# Patient Record
Sex: Female | Born: 1960 | Race: Black or African American | Hispanic: No | Marital: Married | Smoking: Never smoker
Health system: Southern US, Community
[De-identification: ages and names within clinical notes are randomized; demographics above are authoritative.]

## PROBLEM LIST (undated history)

## (undated) HISTORY — PX: HERNIA REPAIR: SHX51

## (undated) HISTORY — PX: OTHER SURGICAL HISTORY: SHX169

## (undated) HISTORY — PX: ABDOMINAL HYSTERECTOMY: SHX81

---

## 2004-04-01 DIAGNOSIS — K2900 Acute gastritis without bleeding: Secondary | ICD-10-CM | POA: Insufficient documentation

## 2005-05-04 DIAGNOSIS — H4010X0 Unspecified open-angle glaucoma, stage unspecified: Secondary | ICD-10-CM | POA: Insufficient documentation

## 2011-04-10 DIAGNOSIS — H35419 Lattice degeneration of retina, unspecified eye: Secondary | ICD-10-CM | POA: Insufficient documentation

## 2011-08-17 DIAGNOSIS — N750 Cyst of Bartholin's gland: Secondary | ICD-10-CM | POA: Insufficient documentation

## 2012-08-17 DIAGNOSIS — M76899 Other specified enthesopathies of unspecified lower limb, excluding foot: Secondary | ICD-10-CM | POA: Insufficient documentation

## 2014-01-15 DIAGNOSIS — Z800 Family history of malignant neoplasm of digestive organs: Secondary | ICD-10-CM | POA: Insufficient documentation

## 2014-09-10 DIAGNOSIS — M545 Low back pain, unspecified: Secondary | ICD-10-CM | POA: Insufficient documentation

## 2014-09-10 DIAGNOSIS — M47816 Spondylosis without myelopathy or radiculopathy, lumbar region: Secondary | ICD-10-CM | POA: Insufficient documentation

## 2015-07-01 DIAGNOSIS — Z833 Family history of diabetes mellitus: Secondary | ICD-10-CM | POA: Insufficient documentation

## 2017-05-30 ENCOUNTER — Encounter: Payer: Self-pay | Admitting: Emergency Medicine

## 2017-05-30 ENCOUNTER — Observation Stay
Admission: EM | Admit: 2017-05-30 | Discharge: 2017-06-02 | Disposition: A | Discharge status: Home / Self Care | Payer: BC Managed Care – PPO | Attending: Internal Medicine | Admitting: Internal Medicine

## 2017-05-30 ENCOUNTER — Emergency Department: Payer: BC Managed Care – PPO

## 2017-05-30 DIAGNOSIS — K2950 Unspecified chronic gastritis without bleeding: Secondary | ICD-10-CM | POA: Insufficient documentation

## 2017-05-30 DIAGNOSIS — Z888 Allergy status to other drugs, medicaments and biological substances status: Secondary | ICD-10-CM | POA: Diagnosis not present

## 2017-05-30 DIAGNOSIS — R112 Nausea with vomiting, unspecified: Secondary | ICD-10-CM

## 2017-05-30 DIAGNOSIS — K5900 Constipation, unspecified: Secondary | ICD-10-CM | POA: Insufficient documentation

## 2017-05-30 DIAGNOSIS — E876 Hypokalemia: Secondary | ICD-10-CM | POA: Diagnosis not present

## 2017-05-30 DIAGNOSIS — K219 Gastro-esophageal reflux disease without esophagitis: Secondary | ICD-10-CM | POA: Diagnosis not present

## 2017-05-30 DIAGNOSIS — R109 Unspecified abdominal pain: Secondary | ICD-10-CM

## 2017-05-30 DIAGNOSIS — K257 Chronic gastric ulcer without hemorrhage or perforation: Principal | ICD-10-CM | POA: Insufficient documentation

## 2017-05-30 DIAGNOSIS — Z79899 Other long term (current) drug therapy: Secondary | ICD-10-CM | POA: Insufficient documentation

## 2017-05-30 DIAGNOSIS — R1010 Upper abdominal pain, unspecified: Secondary | ICD-10-CM

## 2017-05-30 LAB — CBC
HCT: 46.5 % (ref 35.0–47.0)
HEMOGLOBIN: 15.2 g/dL (ref 12.0–16.0)
MCH: 27.5 pg (ref 26.0–34.0)
MCHC: 32.7 g/dL (ref 32.0–36.0)
MCV: 84 fL (ref 80.0–100.0)
PLATELETS: 372 10*3/uL (ref 150–440)
RBC: 5.53 MIL/uL — AB (ref 3.80–5.20)
RDW: 14 % (ref 11.5–14.5)
WBC: 12.9 10*3/uL — AB (ref 3.6–11.0)

## 2017-05-30 LAB — TROPONIN I

## 2017-05-30 LAB — COMPREHENSIVE METABOLIC PANEL
ALK PHOS: 94 U/L (ref 38–126)
ALT: 14 U/L (ref 14–54)
ANION GAP: 12 (ref 5–15)
AST: 18 U/L (ref 15–41)
Albumin: 4 g/dL (ref 3.5–5.0)
BUN: 19 mg/dL (ref 6–20)
CALCIUM: 9.7 mg/dL (ref 8.9–10.3)
CO2: 23 mmol/L (ref 22–32)
CREATININE: 0.94 mg/dL (ref 0.44–1.00)
Chloride: 104 mmol/L (ref 101–111)
Glucose, Bld: 122 mg/dL — ABNORMAL HIGH (ref 65–99)
Potassium: 3.4 mmol/L — ABNORMAL LOW (ref 3.5–5.1)
SODIUM: 139 mmol/L (ref 135–145)
TOTAL PROTEIN: 8.1 g/dL (ref 6.5–8.1)
Total Bilirubin: 0.7 mg/dL (ref 0.3–1.2)

## 2017-05-30 LAB — LIPASE, BLOOD: Lipase: 26 U/L (ref 11–51)

## 2017-05-30 MED ORDER — FENTANYL CITRATE (PF) 100 MCG/2ML IJ SOLN
50.0000 ug | Freq: Once | INTRAMUSCULAR | Status: AC
  Administered 2017-05-30: 50 ug via INTRAVENOUS
  Filled 2017-05-30: qty 2

## 2017-05-30 MED ORDER — SODIUM CHLORIDE 0.9 % IV BOLUS (SEPSIS)
1000.0000 mL | Freq: Once | INTRAVENOUS | Status: AC
  Administered 2017-05-30: 1000 mL via INTRAVENOUS

## 2017-05-30 MED ORDER — ONDANSETRON HCL 4 MG/2ML IJ SOLN
4.0000 mg | Freq: Once | INTRAMUSCULAR | Status: AC
  Administered 2017-05-30: 4 mg via INTRAVENOUS
  Filled 2017-05-30: qty 2

## 2017-05-30 MED ORDER — IOPAMIDOL (ISOVUE-300) INJECTION 61%
30.0000 mL | Freq: Once | INTRAVENOUS | Status: AC | PRN
  Administered 2017-05-30: 30 mL via ORAL

## 2017-05-30 MED ORDER — IOPAMIDOL (ISOVUE-300) INJECTION 61%
100.0000 mL | Freq: Once | INTRAVENOUS | Status: AC | PRN
  Administered 2017-05-30: 100 mL via INTRAVENOUS

## 2017-05-30 NOTE — ED Triage Notes (Signed)
Pt states bilateral upper quadrant pain with nausea and vomiting since Friday. Pt appears in distress. Pt denies known fever. Denies diarrhea or shob. Last bowel movement on Wednesday.

## 2017-05-30 NOTE — ED Provider Notes (Signed)
[facility] Emergency Department Provider Note   ____________________________________________   First MD Initiated Contact with Patient 05/30/17 2245     (approximate)  I have reviewed the triage vital signs and the nursing notes.   HISTORY  Chief Complaint Abdominal Pain and Emesis    HPI Kathryn Bryan is a 56 y.o. female who presents to the ED from home with a chief complaint of abdominal pain and vomiting.  Patient reports onset of bilateral upper abdominal pain 2 days ago associated with nausea and vomiting.  Describes waxing/waning, aching type pain.  Has not eaten for 2 days because she is scared to. Last bowel movement 4 days ago which is normal for patient.  Denies associated fever, chills, chest pain, shortness of breath, dysuria, diarrhea.  Denies recent travel or trauma.   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 NEGATIVE NUCLEAR STRESS TEST ON 09/16/06.   Toxic diffuse goiter      Patient Active Problem List   Diagnosis Date Noted  . Nausea & vomiting 05/31/2017    Past Surgical History HERNIA REPAIR     HYSTERECTOMY     PR COLORECTAL SCRN; HI RISK IND       Prior to Admission medications   Not on File    Allergies Ibuprofen  Family History  Problem Relation Age of Onset  . Heart failure Mother     Social History Social History   Tobacco Use  . Smoking status: Never Smoker  . Smokeless tobacco: Never Used  Substance Use Topics  . Alcohol use: No   Frequency: Never  . Drug use: No    Review of Systems  Constitutional: No fever/chills. Eyes: No visual changes. ENT: No sore throat. Cardiovascular: Denies chest pain. Respiratory: Denies shortness of breath. Gastrointestinal: Positive for abdominal pain, nausea and vomiting.  No diarrhea.  No constipation. Genitourinary: Negative for dysuria. Musculoskeletal: Negative for back pain. Skin: Negative for rash. Neurological: Negative for headaches, focal weakness or numbness.   ____________________________________________   PHYSICAL EXAM:  VITAL SIGNS: ED Triage Vitals  Enc Vitals Group     BP 05/30/17 2229 121/62     Pulse Rate 05/30/17 2229 91     Resp 05/30/17 2229 20     Temp 05/30/17 2229 98.3 F (36.8 C)     Temp Source 05/30/17 2229 Oral     SpO2 05/30/17 2229 100 %     Weight 05/30/17 2227 220 lb (99.8 kg)     Height 05/30/17 2227 5\' 1"  (1.549 m)     Head Circumference --      Peak Flow --      Pain Score 05/30/17 2226 9     Pain Loc --      Pain Edu? --      Excl. in GC? --     Constitutional: Alert and oriented. Well appearing and in mild acute distress. Eyes: Conjunctivae are normal. PERRL. EOMI. Head: Atraumatic. Nose: No congestion/rhinnorhea. Mouth/Throat: Mucous membranes are moist.  Oropharynx non-erythematous. Neck: No stridor.   Cardiovascular: Normal rate, regular rhythm. Grossly normal heart sounds.  Good peripheral circulation. Respiratory: Normal respiratory effort.  No retractions. Lungs CTAB. Gastrointestinal: Obese.  Soft and tender to palpation bilateral upper quadrants and umbilicus without rebound or guarding.  Mild distention. No abdominal bruits. No CVA tenderness. Musculoskeletal: No lower extremity tenderness nor edema.  No joint effusions. Neurologic:  Normal speech and language. No gross focal neurologic deficits are appreciated. No gait instability. Skin:  Skin is warm, dry and intact. No rash noted. Psychiatric: Mood and affect  are normal. Speech and behavior are normal.  ____________________________________________   LABS (all labs ordered are listed, but only abnormal results are displayed)  Labs Reviewed  COMPREHENSIVE METABOLIC PANEL - Abnormal; Notable for the following components:      Result Value   Potassium 3.4 (*)    Glucose, Bld 122 (*)    All other components within normal limits  CBC - Abnormal; Notable for the following components:   WBC 12.9 (*)    RBC 5.53 (*)    All other components within normal limits  URINALYSIS, COMPLETE (UACMP) WITH MICROSCOPIC - Abnormal; Notable for the following components:   Color, Urine STRAW (*)    APPearance CLEAR (*)    Hgb urine dipstick SMALL (*)    Ketones, ur 5 (*)    Squamous Epithelial / LPF 0-5 (*)    All other components within normal limits  LIPASE, BLOOD  TROPONIN I  HIV ANTIBODY (ROUTINE TESTING)  CBC  BASIC METABOLIC PANEL   ____________________________________________  EKG  ED ECG REPORT I, Larance Ratledge J, the attending physician, personally viewed and interpreted this ECG.   Date: 05/30/2017  EKG Time: 2226  Rate: 75  Rhythm: normal EKG, normal sinus rhythm  Axis: Normal  Intervals:none  ST&T Change: Nonspecific  ____________________________________________  RADIOLOGY  Ct Abdomen Pelvis W Contrast  Result Date: 05/31/2017 CLINICAL DATA:  Acute onset of upper abdominal pain, nausea and vomiting. EXAM: CT ABDOMEN AND PELVIS WITH CONTRAST TECHNIQUE: Multidetector CT imaging of the abdomen and pelvis was performed using the standard protocol following bolus administration of intravenous contrast. CONTRAST:  100mL ISOVUE-300 IOPAMIDOL (ISOVUE-300) INJECTION 61% COMPARISON:  None. FINDINGS: Lower chest: The visualized lung bases are grossly clear. The visualized portions of the mediastinum are unremarkable. Hepatobiliary: Tiny nonspecific hypodensities are noted within the liver, measuring up to 8 mm in size. The liver is otherwise  unremarkable. The gallbladder is within normal limits. The common bile duct remains normal in caliber. Pancreas: The pancreas is within normal limits. Spleen: The spleen is unremarkable in appearance. Adrenals/Urinary Tract: The adrenal glands are unremarkable in appearance. The kidneys are within normal limits. There is no evidence of hydronephrosis. No renal or ureteral stones are identified. No perinephric stranding is seen. Stomach/Bowel: There is circumferential wall thickening at the antrum of the stomach and the pylorus. This may reflect an acute infectious or inflammatory process, though underlying mass cannot be excluded. The small bowel is within normal limits. The appendix is normal in caliber, without evidence of appendicitis. The colon is unremarkable in appearance. Vascular/Lymphatic: The abdominal aorta is unremarkable in appearance. The inferior vena cava is grossly unremarkable. No retroperitoneal lymphadenopathy is seen. No pelvic sidewall lymphadenopathy is identified. Reproductive: The bladder is mildly distended and within normal limits. The uterus is grossly unremarkable in appearance. The ovaries are relatively symmetric. No suspicious adnexal masses are seen. Other: 3 small anterior abdominal wall hernias are noted at the mid abdomen, containing only fat. Musculoskeletal: No acute osseous abnormalities are identified. Mild facet disease is noted at  the lower lumbar spine. The visualized musculature is unremarkable in appearance. IMPRESSION: 1. Circumferential wall thickening at the antrum of the stomach and the pylorus. This may reflect an acute infectious or inflammatory process, though underlying mass cannot be excluded. Would correlate with the patient's symptoms. Endoscopy is recommended for further evaluation, when and as deemed clinically appropriate. 2. Tiny nonspecific hypodensities within the liver measure up to 8 mm in size. 3. 3 small anterior abdominal wall hernias at the mid  abdomen, containing only fat. Electronically Signed   By: Roanna RaiderJeffery  Chang M.D.   On: 05/31/2017 00:13    ____________________________________________   PROCEDURES  Procedure(s) performed: None  Procedures  Critical Care performed: No  ____________________________________________   INITIAL IMPRESSION / ASSESSMENT AND PLAN / ED COURSE  As part of my medical decision making, I reviewed the following data within the electronic MEDICAL RECORD NUMBER History obtained from family, Nursing notes reviewed and incorporated, Labs reviewed, EKG interpreted, Radiograph reviewed  and Notes from prior ED visits.   56 year old female who presents with bilateral upper abdominal and umbilical pain; history of umbilical hernia repair. Differential diagnosis includes, but is not limited to, biliary disease (biliary colic, acute cholecystitis, cholangitis, choledocholithiasis, etc), intrathoracic causes for epigastric abdominal pain including ACS, gastritis, duodenitis, pancreatitis, small bowel or large bowel obstruction, abdominal aortic aneurysm, hernia, and gastritis.  Laboratory results remarkable for mild leukocytosis; LFTs and lipase unremarkable.  Given patient's surgical history, will obtain CT abdomen/pelvis to evaluate for intra-abdominal etiology.  IV analgesia with antiemetic administered.  Clinical Course as of May 31 438  Mon May 31, 2017  0100 Patient dry heaving.  Updated patient and spouse of CT imaging results.  Will administer additional IV fluids, Pepcid, Phenergan and Dilaudid.  [JS]  0256 Patient complains of persistent pain and nausea.  Will discuss with hospitalist Dr. Tobi BastosPyreddy to evaluate patient in the emergency department for admission.  [JS]    Clinical Course User Index [JS] Irean HongSung, Joanna Borawski J, MD     ____________________________________________   FINAL CLINICAL IMPRESSION(S) / ED DIAGNOSES  Final diagnoses:  Pain of upper abdomen  Non-intractable vomiting with nausea,  unspecified vomiting type     ED Discharge Orders    None       Note:  This document was prepared using Dragon voice recognition software and may include unintentional dictation errors.    Irean HongSung, Donice Alperin J, MD 05/31/17 [phone]

## 2017-05-31 ENCOUNTER — Observation Stay: Payer: BC Managed Care – PPO | Admitting: Anesthesiology

## 2017-05-31 ENCOUNTER — Encounter
Admission: EM | Disposition: A | Discharge status: Home / Self Care | Payer: Self-pay | Source: Home / Self Care | Attending: Emergency Medicine

## 2017-05-31 ENCOUNTER — Other Ambulatory Visit: Payer: Self-pay

## 2017-05-31 ENCOUNTER — Encounter: Payer: Self-pay | Admitting: Internal Medicine

## 2017-05-31 DIAGNOSIS — R1013 Epigastric pain: Secondary | ICD-10-CM | POA: Diagnosis not present

## 2017-05-31 DIAGNOSIS — R935 Abnormal findings on diagnostic imaging of other abdominal regions, including retroperitoneum: Secondary | ICD-10-CM

## 2017-05-31 DIAGNOSIS — R1010 Upper abdominal pain, unspecified: Secondary | ICD-10-CM

## 2017-05-31 DIAGNOSIS — R112 Nausea with vomiting, unspecified: Secondary | ICD-10-CM | POA: Diagnosis not present

## 2017-05-31 LAB — BASIC METABOLIC PANEL
Anion gap: 7 (ref 5–15)
BUN: 16 mg/dL (ref 6–20)
CHLORIDE: 109 mmol/L (ref 101–111)
CO2: 23 mmol/L (ref 22–32)
Calcium: 8.1 mg/dL — ABNORMAL LOW (ref 8.9–10.3)
Creatinine, Ser: 0.8 mg/dL (ref 0.44–1.00)
GFR calc Af Amer: >60 mL/min (ref >60)
GFR calc non Af Amer: >60 mL/min (ref >60)
GLUCOSE: 116 mg/dL — AB (ref 65–99)
POTASSIUM: 3.3 mmol/L — AB (ref 3.5–5.1)
Sodium: 139 mmol/L (ref 135–145)

## 2017-05-31 LAB — IRON AND TIBC
Iron: 40 ug/dL (ref 28–170)
SATURATION RATIOS: 18 % (ref 10.4–31.8)
TIBC: 225 ug/dL — AB (ref 250–450)
UIBC: 185 ug/dL

## 2017-05-31 LAB — FERRITIN: Ferritin: 125 ng/mL (ref 11–307)

## 2017-05-31 LAB — CBC
HEMATOCRIT: 40.4 % (ref 35.0–47.0)
HEMOGLOBIN: 13.2 g/dL (ref 12.0–16.0)
MCH: 27.9 pg (ref 26.0–34.0)
MCHC: 32.6 g/dL (ref 32.0–36.0)
MCV: 85.6 fL (ref 80.0–100.0)
PLATELETS: 293 10*3/uL (ref 150–440)
RBC: 4.71 MIL/uL (ref 3.80–5.20)
RDW: 14.6 % — ABNORMAL HIGH (ref 11.5–14.5)
WBC: 10.2 10*3/uL (ref 3.6–11.0)

## 2017-05-31 LAB — URINALYSIS, COMPLETE (UACMP) WITH MICROSCOPIC
BACTERIA UA: NONE SEEN
Bilirubin Urine: NEGATIVE
Glucose, UA: NEGATIVE mg/dL
KETONES UR: 5 mg/dL — AB
LEUKOCYTES UA: NEGATIVE
Nitrite: NEGATIVE
PH: 7 (ref 5.0–8.0)
PROTEIN: NEGATIVE mg/dL
Specific Gravity, Urine: 1.029 (ref 1.005–1.030)

## 2017-05-31 LAB — FOLATE: FOLATE: 14.1 ng/mL (ref >5.9)

## 2017-05-31 LAB — VITAMIN B12: VITAMIN B 12: 575 pg/mL (ref 180–914)

## 2017-05-31 SURGERY — EGD (ESOPHAGOGASTRODUODENOSCOPY)
Anesthesia: General

## 2017-05-31 MED ORDER — ONDANSETRON HCL 4 MG PO TABS: 4.0000 mg | ORAL_TABLET | Freq: Four times a day (QID) | ORAL | Status: DC | PRN

## 2017-05-31 MED ORDER — HYDROMORPHONE HCL 1 MG/ML IJ SOLN
0.5000 mg | Freq: Once | INTRAMUSCULAR | Status: AC
  Administered 2017-05-31: 0.5 mg via INTRAVENOUS
  Filled 2017-05-31: qty 1

## 2017-05-31 MED ORDER — SENNOSIDES-DOCUSATE SODIUM 8.6-50 MG PO TABS: 1.0000 | ORAL_TABLET | Freq: Every evening | ORAL | Status: DC | PRN

## 2017-05-31 MED ORDER — PROPOFOL 500 MG/50ML IV EMUL
INTRAVENOUS | Status: DC | PRN
  Administered 2017-05-31: 140 ug/kg/min via INTRAVENOUS

## 2017-05-31 MED ORDER — SODIUM CHLORIDE 0.9 % IV SOLN
INTRAVENOUS | Status: DC | PRN
  Administered 2017-05-31: 14:00:00 via INTRAVENOUS

## 2017-05-31 MED ORDER — FAMOTIDINE IN NACL 20-0.9 MG/50ML-% IV SOLN
20.0000 mg | Freq: Once | INTRAVENOUS | Status: AC
Start: 2017-05-31 — End: 2017-05-31
  Administered 2017-05-31: 20 mg via INTRAVENOUS
  Filled 2017-05-31: qty 50

## 2017-05-31 MED ORDER — SODIUM CHLORIDE 0.9 % IV BOLUS (SEPSIS)
1000.0000 mL | Freq: Once | INTRAVENOUS | Status: AC
  Administered 2017-05-31: 1000 mL via INTRAVENOUS

## 2017-05-31 MED ORDER — ENOXAPARIN SODIUM 40 MG/0.4ML ~~LOC~~ SOLN: 40.0000 mg | SUBCUTANEOUS | Status: DC

## 2017-05-31 MED ORDER — ONDANSETRON HCL 4 MG/2ML IJ SOLN
4.0000 mg | Freq: Once | INTRAMUSCULAR | Status: AC
Start: 2017-05-31 — End: 2017-05-31
  Administered 2017-05-31: 4 mg via INTRAVENOUS
  Filled 2017-05-31: qty 2

## 2017-05-31 MED ORDER — MORPHINE SULFATE (PF) 2 MG/ML IV SOLN
2.0000 mg | INTRAVENOUS | Status: DC | PRN
  Administered 2017-05-31 – 2017-06-01 (×4): 2 mg via INTRAVENOUS
  Filled 2017-05-31 (×4): qty 1

## 2017-05-31 MED ORDER — ACETAMINOPHEN 325 MG PO TABS
650.0000 mg | ORAL_TABLET | Freq: Four times a day (QID) | ORAL | Status: DC | PRN
  Administered 2017-06-01: 650 mg via ORAL
  Filled 2017-05-31: qty 2

## 2017-05-31 MED ORDER — DEXTROSE-NACL 5-0.45 % IV SOLN
INTRAVENOUS | Status: DC
  Administered 2017-05-31 – 2017-06-01 (×3): via INTRAVENOUS

## 2017-05-31 MED ORDER — LIDOCAINE HCL (CARDIAC) 20 MG/ML IV SOLN
INTRAVENOUS | Status: DC | PRN
  Administered 2017-05-31: 100 mg via INTRAVENOUS

## 2017-05-31 MED ORDER — PROMETHAZINE HCL 25 MG/ML IJ SOLN
25.0000 mg | Freq: Once | INTRAMUSCULAR | Status: AC
  Administered 2017-05-31: 25 mg via INTRAVENOUS
  Filled 2017-05-31: qty 1

## 2017-05-31 MED ORDER — ACETAMINOPHEN 650 MG RE SUPP: 650.0000 mg | Freq: Four times a day (QID) | RECTAL | Status: DC | PRN

## 2017-05-31 MED ORDER — GLYCOPYRROLATE 0.2 MG/ML IJ SOLN
INTRAMUSCULAR | Status: DC | PRN
  Administered 2017-05-31: 0.2 mg via INTRAVENOUS

## 2017-05-31 MED ORDER — PROPOFOL 10 MG/ML IV BOLUS
INTRAVENOUS | Status: DC | PRN
  Administered 2017-05-31: 20 mg via INTRAVENOUS
  Administered 2017-05-31: 50 mg via INTRAVENOUS

## 2017-05-31 MED ORDER — FENTANYL CITRATE (PF) 100 MCG/2ML IJ SOLN
50.0000 ug | Freq: Once | INTRAMUSCULAR | Status: AC
  Administered 2017-05-31: 50 ug via INTRAVENOUS
  Filled 2017-05-31: qty 2

## 2017-05-31 MED ORDER — PANTOPRAZOLE SODIUM 40 MG IV SOLR
40.0000 mg | Freq: Two times a day (BID) | INTRAVENOUS | Status: DC
  Administered 2017-05-31 – 2017-06-01 (×4): 40 mg via INTRAVENOUS
  Filled 2017-05-31 (×4): qty 40

## 2017-05-31 MED ORDER — ONDANSETRON HCL 4 MG/2ML IJ SOLN
4.0000 mg | Freq: Four times a day (QID) | INTRAMUSCULAR | Status: DC | PRN
  Administered 2017-05-31: 4 mg via INTRAVENOUS
  Filled 2017-05-31: qty 2

## 2017-05-31 MED ORDER — POTASSIUM CHLORIDE 10 MEQ/100ML IV SOLN
10.0000 meq | INTRAVENOUS | Status: AC
  Administered 2017-05-31 (×2): 10 meq via INTRAVENOUS
  Filled 2017-05-31 (×4): qty 100

## 2017-05-31 MED ORDER — PANTOPRAZOLE SODIUM 40 MG IV SOLR
40.0000 mg | INTRAVENOUS | Status: DC
  Administered 2017-05-31: 40 mg via INTRAVENOUS
  Filled 2017-05-31: qty 40

## 2017-05-31 NOTE — Anesthesia Post-op Follow-up Note (Signed)
Anesthesia QCDR form completed.        

## 2017-05-31 NOTE — Transfer of Care (Signed)
Immediate Anesthesia Transfer of Care Note  Patient: Kathryn Bryan  Procedure(s) Performed: ESOPHAGOGASTRODUODENOSCOPY (EGD) (N/A )  Patient Location: PACU  Anesthesia Type:General  Level of Consciousness: awake, alert  and oriented  Airway & Oxygen Therapy: Patient Spontanous Breathing and Patient connected to nasal cannula oxygen  Post-op Assessment: Report given to RN and Post -op Vital signs reviewed and stable  Post vital signs: Reviewed and stable  Last Vitals:  Vitals:   05/31/17 0304 05/31/17 0412  BP: 115/60 115/75  Pulse: 60 65  Resp: 16 18  Temp:  37 C  SpO2: 99% 100%    Last Pain:  Vitals:   05/31/17 0638  TempSrc:   PainSc: Asleep         Complications: No apparent anesthesia complications

## 2017-05-31 NOTE — ED Notes (Signed)
Informed floor RN that this RN would administer protonix prior to bringing patient to floor

## 2017-05-31 NOTE — ED Notes (Signed)
Pt back from ct,states that she still feels bad and the abd cramping has slowed down some, cont to monitor

## 2017-05-31 NOTE — ED Notes (Addendum)
Family member appears irate at delay for pt to receive, delay explained that primary RN was next door with critical pt, family reports "ok. I holding you up"

## 2017-05-31 NOTE — Progress Notes (Signed)
EGD post procedure note  Findings:  The duodenal bulb and second portion of the duodenum were normal. 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 The lesion was 30 mm in largest dimension. There is no evidence of perforation. Biopsies were taken with a cold forceps for histology to rule out malignancy. The cardia, gastric fundus, gastric body and incisura were normal. Biopsies were taken with a cold forceps for Helicobacter pylori testing.   Recommendations:  - Await pathology results to r/o malignancy. - Consult oncology if biopsies confirm malignancy - ? If liver nodules seen on CT are metastatic lesions - Return patient to hospital ward for ongoing care. - Advance diet as tolerated. - Continue present medications. - Avoid NSAIDs - Use Prilosec (omeprazole) 40 mg PO BID for 6 months. - Follow up in GI clinic in 2-3 weeks after discharge  Arlyss Repressohini R Dorrene Bently, MD [address]  [address]  [place], [place] [address]  Main: [phone]  Fax: [phone] Pager: [phone]

## 2017-05-31 NOTE — Progress Notes (Signed)
[facility] [place] [facility]   PATIENT NAME: Kathryn Bryan    MR#:  [id number]  DATE OF BIRTH:  15-Feb-1961  SUBJECTIVE:  CHIEF COMPLAINT:   Chief Complaint  Patient presents with  . Abdominal Pain  . Emesis   -Patient has been having on and off abdominal pain especially in the epigastric region with nausea and vomiting.  Worse since the last 4 days.  REVIEW OF SYSTEMS:  Review of Systems  Constitutional: Negative for chills, fever and malaise/fatigue.  HENT: Negative for congestion, ear discharge, hearing loss and nosebleeds.   Eyes: Negative for blurred vision and double vision.  Respiratory: Negative for cough, shortness of breath and wheezing.   Cardiovascular: Negative for chest pain, palpitations and leg swelling.  Gastrointestinal: Positive for abdominal pain, nausea and vomiting. Negative for constipation and diarrhea.  Genitourinary: Negative for dysuria.  Musculoskeletal: Negative for myalgias.  Neurological: Negative for dizziness, speech change, focal weakness, seizures and headaches.  Psychiatric/Behavioral: Negative for depression.    DRUG ALLERGIES:   Allergies  Allergen Reactions  . Ibuprofen     Pt states has ulcers    VITALS:  Blood pressure 115/75, pulse 65, temperature 98.6 F (37 C), temperature source Oral, resp. rate 18, height 5\' 1"  (1.549 m), weight 96.6 kg (213 lb), SpO2 100 %.  PHYSICAL EXAMINATION:  Physical Exam  GENERAL:  56 y.o.-year-old patient lying in the bed with no acute distress.  EYES: Pupils equal, round, reactive to light and accommodation. No scleral icterus. Extraocular muscles intact.  HEENT: Head atraumatic, normocephalic. Oropharynx and nasopharynx clear.  NECK:  Supple, no jugular venous distention. No thyroid enlargement, no tenderness.  LUNGS: Normal breath sounds bilaterally, no wheezing, rales,rhonchi or crepitation. No use of accessory muscles of respiration.  CARDIOVASCULAR: S1, S2 normal.  No murmurs, rubs, or gallops.  ABDOMEN: Soft, nontender except minimal epigastric tenderness., nondistended. Bowel sounds present. No organomegaly or mass.  EXTREMITIES: No pedal edema, cyanosis, or clubbing.  NEUROLOGIC: Cranial nerves II through XII are intact. Muscle strength 5/5 in all extremities. Sensation intact. Gait not checked.  PSYCHIATRIC: The patient is alert and oriented x 3.  SKIN: No obvious rash, lesion, or ulcer.    LABORATORY PANEL:   CBC Recent Labs  Lab 05/31/17 0614  WBC 10.2  HGB 13.2  HCT 40.4  PLT 293   ------------------------------------------------------------------------------------------------------------------  Chemistries  Recent Labs  Lab 05/30/17 2235 05/31/17 0614  NA 139 139  K 3.4* 3.3*  CL 104 109  CO2 23 23  GLUCOSE 122* 116*  BUN 19 16  CREATININE 0.94 0.80  CALCIUM 9.7 8.1*  AST 18  --   ALT 14  --   ALKPHOS 94  --   BILITOT 0.7  --    ------------------------------------------------------------------------------------------------------------------  Cardiac Enzymes Recent Labs  Lab 05/30/17 2235  TROPONINI <0.03   ------------------------------------------------------------------------------------------------------------------  RADIOLOGY:  Ct Abdomen Pelvis W Contrast  Result Date: 05/31/2017 CLINICAL DATA:  Acute onset of upper abdominal pain, nausea and vomiting. EXAM: CT ABDOMEN AND PELVIS WITH CONTRAST TECHNIQUE: Multidetector CT imaging of the abdomen and pelvis was performed using the standard protocol following bolus administration of intravenous contrast. CONTRAST:  100mL ISOVUE-300 IOPAMIDOL (ISOVUE-300) INJECTION 61% COMPARISON:  None. FINDINGS: Lower chest: The visualized lung bases are grossly clear. The visualized portions of the mediastinum are unremarkable. Hepatobiliary: Tiny nonspecific hypodensities are noted within the liver, measuring up to 8 mm in size. The liver is otherwise unremarkable. The  gallbladder is within normal  limits. The common bile duct remains normal in caliber. Pancreas: The pancreas is within normal limits. Spleen: The spleen is unremarkable in appearance. Adrenals/Urinary Tract: The adrenal glands are unremarkable in appearance. The kidneys are within normal limits. There is no evidence of hydronephrosis. No renal or ureteral stones are identified. No perinephric stranding is seen. Stomach/Bowel: There is circumferential wall thickening at the antrum of the stomach and the pylorus. This may reflect an acute infectious or inflammatory process, though underlying mass cannot be excluded. The small bowel is within normal limits. The appendix is normal in caliber, without evidence of appendicitis. The colon is unremarkable in appearance. Vascular/Lymphatic: The abdominal aorta is unremarkable in appearance. The inferior vena cava is grossly unremarkable. No retroperitoneal lymphadenopathy is seen. No pelvic sidewall lymphadenopathy is identified. Reproductive: The bladder is mildly distended and within normal limits. The uterus is grossly unremarkable in appearance. The ovaries are relatively symmetric. No suspicious adnexal masses are seen. Other: 3 small anterior abdominal wall hernias are noted at the mid abdomen, containing only fat. Musculoskeletal: No acute osseous abnormalities are identified. Mild facet disease is noted at the lower lumbar spine. The visualized musculature is unremarkable in appearance. IMPRESSION: 1. Circumferential wall thickening at the antrum of the stomach and the pylorus. This may reflect an acute infectious or inflammatory process, though underlying mass cannot be excluded. Would correlate with the patient's symptoms. Endoscopy is recommended for further evaluation, when and as deemed clinically appropriate. 2. Tiny nonspecific hypodensities within the liver measure up to 8 mm in size. 3. 3 small anterior abdominal wall hernias at the mid abdomen, containing  only fat. Electronically Signed   By: Roanna RaiderJeffery  Chang M.D.   On: 05/31/2017 00:13    EKG:   Orders placed or performed during the hospital encounter of 05/30/17  . EKG 12-Lead  . EKG 12-Lead  . ED EKG  . ED EKG    ASSESSMENT AND PLAN:   56 year old female with no significant past medical history presenting with epigastric pain associated with nausea and vomiting.  Going on for a few months now, but much worse in the last 4 days.  1.  Epigastric pain with nausea, vomiting-CT of the abdomen showing gastric antral wall thickening, no obstruction noted. -Could be acute infectious pathology or gastritis.  Underlying mass cannot be ruled out. -GI consult and possible EGD today -Continue to keep n.p.o. at this time -Nausea and vomiting have improved. -Continue IV fluids - protonix iV bid  2.  Hypokalemia-being replaced  3.  DVT prophylaxis-resume Lovenox after EGD   All the records are reviewed and case discussed with Care Management/Social Workerr. Management plans discussed with the patient, family and they are in agreement.  CODE STATUS: Full code  TOTAL TIME TAKING CARE OF THIS PATIENT: 36 minutes.   POSSIBLE D/C IN 1-2 DAYS, DEPENDING ON CLINICAL CONDITION.   Enid BaasKALISETTI,Cleora Karnik M.D on 05/31/2017 at 11:22 AM  Between 7am to 6pm - Pager - [phone]  After 6pm go to [email or web address] - Social research officer, governmentpassword [email or web address]  [facility]  Office  [phone]  CC: Primary care physician; Charlies ConstableSpencer, Donald, MD

## 2017-05-31 NOTE — Anesthesia Postprocedure Evaluation (Signed)
Anesthesia Post Note  Patient: Alexza Ciccone  Procedure(s) Performed: ESOPHAGOGASTRODUODENOSCOPY (EGD) (N/A )  Patient location during evaluation: Endoscopy Anesthesia Type: General Level of consciousness: awake and alert Pain management: pain level controlled Vital Signs Assessment: post-procedure vital signs reviewed and stable Respiratory status: spontaneous breathing, nonlabored ventilation, respiratory function stable and patient connected to nasal cannula oxygen Cardiovascular status: blood pressure returned to baseline and stable Postop Assessment: no apparent nausea or vomiting Anesthetic complications: no     Last Vitals:  Vitals:   05/31/17 1439 05/31/17 1514  BP: 128/60 122/68  Pulse: 75 88  Resp: (!) 23 17  Temp:  36.8 C  SpO2: 100% 98%    Last Pain:  Vitals:   05/31/17 1514  TempSrc: Oral  PainSc:                  Cleda MccreedyJoseph K Piscitello

## 2017-05-31 NOTE — H&P (Signed)
[facility] [place] [facility]   PATIENT NAME: Kathryn Bryan    MR#:  [id number]  DATE OF BIRTH:  12/05/1960  DATE OF ADMISSION:  05/30/2017  PRIMARY CARE PHYSICIAN: Charlies ConstableSpencer, Donald, MD   REQUESTING/REFERRING PHYSICIAN:   CHIEF COMPLAINT:   Chief Complaint  Patient presents with  . Abdominal Pain  . Emesis    HISTORY OF PRESENT ILLNESS: Kathryn Bryan  is a 56 y.o. female with no significant past medical history presented to the emergency room with abdominal pain, nausea and vomiting since Friday. The vomitus contained food and water. No history of any hematemesis, hemoptysis. The abdominal pain is located in the epigastrium sharp in nature 6 out of 10 on a scale of 1-10. Patient was worked up with CT abdomen which showed circumferential wall thickening in the antrum and pylorus the stomach. No history of any recent travel, sick contacts at home. Hospitalist service was consulted. Patient unable to drink any fluids and eat food secondary to intractable nausea and vomiting.  PAST MEDICAL HISTORY:  History reviewed. No pertinent past medical history.  PAST SURGICAL HISTORY:  Past Surgical History:  Procedure Laterality Date  . none      SOCIAL HISTORY:  Social History   Tobacco Use  . Smoking status: Never Smoker  . Smokeless tobacco: Never Used  Substance Use Topics  . Alcohol use: No    Frequency: Never    FAMILY HISTORY:  Family History  Problem Relation Age of Onset  . Heart failure Mother     DRUG ALLERGIES:  Allergies  Allergen Reactions  . Ibuprofen     Pt states has ulcers    REVIEW OF SYSTEMS:   CONSTITUTIONAL: No fever, fatigue or weakness.  EYES: No blurred or double vision.  EARS, NOSE, AND THROAT: No tinnitus or ear pain.  RESPIRATORY: No cough, shortness of breath, wheezing or hemoptysis.  CARDIOVASCULAR: No chest pain, orthopnea, edema.  GASTROINTESTINAL: Has nausea, vomiting,  abdominal pain.  No  diarrhea. GENITOURINARY: No dysuria, hematuria.  ENDOCRINE: No polyuria, nocturia,  HEMATOLOGY: No anemia, easy bruising or bleeding SKIN: No rash or lesion. MUSCULOSKELETAL: No joint pain or arthritis.   NEUROLOGIC: No tingling, numbness, weakness.  PSYCHIATRY: No anxiety or depression.   MEDICATIONS AT HOME:  Prior to Admission medications   Not on File      PHYSICAL EXAMINATION:   VITAL SIGNS: Blood pressure 115/60, pulse 60, temperature 98.3 F (36.8 C), temperature source Oral, resp. rate 16, height 5\' 1"  (1.549 m), weight 99.8 kg (220 lb), SpO2 99 %.  GENERAL:  56 y.o.-year-old patient lying in the bed with no acute distress.  EYES: Pupils equal, round, reactive to light and accommodation. No scleral icterus. Extraocular muscles intact.  HEENT: Head atraumatic, normocephalic. Oropharynx dry and nasopharynx clear.  NECK:  Supple, no jugular venous distention. No thyroid enlargement, no tenderness.  LUNGS: Normal breath sounds bilaterally, no wheezing, rales,rhonchi or crepitation. No use of accessory muscles of respiration.  CARDIOVASCULAR: S1, S2 normal. No murmurs, rubs, or gallops.  ABDOMEN: Soft, tenderness in epigastrium, nondistended. Bowel sounds present.  Hepatomegaly noted. EXTREMITIES: No pedal edema, cyanosis, or clubbing.  NEUROLOGIC: Cranial nerves II through XII are intact. Muscle strength 5/5 in all extremities. Sensation intact. Gait not checked.  PSYCHIATRIC: The patient is alert and oriented x 3.  SKIN: No obvious rash, lesion, or ulcer.   LABORATORY PANEL:   CBC Recent Labs  Lab 05/30/17 2235  WBC 12.9*  HGB 15.2  HCT 46.5  PLT 372  MCV 84.0  MCH 27.5  MCHC 32.7  RDW 14.0   ------------------------------------------------------------------------------------------------------------------  Chemistries  Recent Labs  Lab 05/30/17 2235  NA 139  K 3.4*  CL 104  CO2 23  GLUCOSE 122*  BUN 19  CREATININE 0.94  CALCIUM 9.7  AST 18  ALT 14   ALKPHOS 94  BILITOT 0.7   ------------------------------------------------------------------------------------------------------------------ estimated creatinine clearance is 72.4 mL/min (by C-G formula based on SCr of 0.94 mg/dL). ------------------------------------------------------------------------------------------------------------------ No results for input(s): TSH, T4TOTAL, T3FREE, THYROIDAB in the last 72 hours.  Invalid input(s): FREET3   Coagulation profile No results for input(s): INR, PROTIME in the last 168 hours. ------------------------------------------------------------------------------------------------------------------- No results for input(s): DDIMER in the last 72 hours. -------------------------------------------------------------------------------------------------------------------  Cardiac Enzymes Recent Labs  Lab 05/30/17 2235  TROPONINI <0.03   ------------------------------------------------------------------------------------------------------------------ Invalid input(s): POCBNP  ---------------------------------------------------------------------------------------------------------------  Urinalysis    Component Value Date/Time   COLORURINE STRAW (A) 05/30/2017 2235   APPEARANCEUR CLEAR (A) 05/30/2017 2235   LABSPEC 1.029 05/30/2017 2235   PHURINE 7.0 05/30/2017 2235   GLUCOSEU NEGATIVE 05/30/2017 2235   HGBUR SMALL (A) 05/30/2017 2235   BILIRUBINUR NEGATIVE 05/30/2017 2235   KETONESUR 5 (A) 05/30/2017 2235   PROTEINUR NEGATIVE 05/30/2017 2235   NITRITE NEGATIVE 05/30/2017 2235   LEUKOCYTESUR NEGATIVE 05/30/2017 2235     RADIOLOGY: Ct Abdomen Pelvis W Contrast  Result Date: 05/31/2017 CLINICAL DATA:  Acute onset of upper abdominal pain, nausea and vomiting. EXAM: CT ABDOMEN AND PELVIS WITH CONTRAST TECHNIQUE: Multidetector CT imaging of the abdomen and pelvis was performed using the standard protocol following bolus administration  of intravenous contrast. CONTRAST:  100mL ISOVUE-300 IOPAMIDOL (ISOVUE-300) INJECTION 61% COMPARISON:  None. FINDINGS: Lower chest: The visualized lung bases are grossly clear. The visualized portions of the mediastinum are unremarkable. Hepatobiliary: Tiny nonspecific hypodensities are noted within the liver, measuring up to 8 mm in size. The liver is otherwise unremarkable. The gallbladder is within normal limits. The common bile duct remains normal in caliber. Pancreas: The pancreas is within normal limits. Spleen: The spleen is unremarkable in appearance. Adrenals/Urinary Tract: The adrenal glands are unremarkable in appearance. The kidneys are within normal limits. There is no evidence of hydronephrosis. No renal or ureteral stones are identified. No perinephric stranding is seen. Stomach/Bowel: There is circumferential wall thickening at the antrum of the stomach and the pylorus. This may reflect an acute infectious or inflammatory process, though underlying mass cannot be excluded. The small bowel is within normal limits. The appendix is normal in caliber, without evidence of appendicitis. The colon is unremarkable in appearance. Vascular/Lymphatic: The abdominal aorta is unremarkable in appearance. The inferior vena cava is grossly unremarkable. No retroperitoneal lymphadenopathy is seen. No pelvic sidewall lymphadenopathy is identified. Reproductive: The bladder is mildly distended and within normal limits. The uterus is grossly unremarkable in appearance. The ovaries are relatively symmetric. No suspicious adnexal masses are seen. Other: 3 small anterior abdominal wall hernias are noted at the mid abdomen, containing only fat. Musculoskeletal: No acute osseous abnormalities are identified. Mild facet disease is noted at the lower lumbar spine. The visualized musculature is unremarkable in appearance. IMPRESSION: 1. Circumferential wall thickening at the antrum of the stomach and the pylorus. This may  reflect an acute infectious or inflammatory process, though underlying mass cannot be excluded. Would correlate with the patient's symptoms. Endoscopy is recommended for further evaluation, when and as deemed clinically appropriate. 2. Tiny nonspecific hypodensities within the liver measure up to 8 mm  in size. 3. 3 small anterior abdominal wall hernias at the mid abdomen, containing only fat. Electronically Signed   By: Roanna Raider M.D.   On: 05/31/2017 00:13    EKG: Orders placed or performed during the hospital encounter of 05/30/17  . EKG 12-Lead  . EKG 12-Lead  . ED EKG  . ED EKG    IMPRESSION AND PLAN: 56 year old female patient with no significant past medical history presented to the emergency room with abdominal pain, nausea and vomiting.  Admitting diagnosis 1. Intractable nausea vomiting 2. Acute gastritis 3. Abdominal hernia 4. Hypokalemia Treatment plan Admit patient to medical floor IV fluid hydration IV Protonix 40 MG daily Gastrocolic consultation for possible endoscopy Replace potassium Pain management with IV morphine  All the records are reviewed and case discussed with ED provider. Management plans discussed with the patient, family and they are in agreement.  CODE STATUS:FULL CODE Code Status History    This patient does not have a recorded code status. Please follow your organizational policy for patients in this situation.       TOTAL TIME TAKING CARE OF THIS PATIENT: 54 minutes.    Ihor Austin M.D on 05/31/2017 at 3:30 AM  Between 7am to 6pm - Pager - [phone]  After 6pm go to [email or web address] - password [email or web address] [facility]  [place] [facility]  Office  [phone]  CC: Primary care physician; Charlies Constable, MD

## 2017-05-31 NOTE — Anesthesia Preprocedure Evaluation (Signed)
Anesthesia Evaluation  Patient identified by MRN, date of birth, ID band Patient awake    Reviewed: Allergy & Precautions, H&P , NPO status , Patient's Chart, lab work & pertinent test results  History of Anesthesia Complications Negative for: history of anesthetic complications  Airway Mallampati: III  TM Distance: >3 FB Neck ROM: full    Dental  (+) Chipped   Pulmonary neg pulmonary ROS, neg shortness of breath,           Cardiovascular Exercise Tolerance: Good (-) angina(-) Past MI and (-) DOE negative cardio ROS       Neuro/Psych negative neurological ROS  negative psych ROS   GI/Hepatic Neg liver ROS, GERD  Medicated and Controlled,  Endo/Other  negative endocrine ROS  Renal/GU negative Renal ROS  negative genitourinary   Musculoskeletal   Abdominal   Peds  Hematology negative hematology ROS (+)   Anesthesia Other Findings Signs and symptoms suggestive of sleep apnea   Patient is NPO appropriate and reports no nausea or vomiting today.   Past Surgical History: No date: none  BMI    Body Mass Index:  40.25 kg/m      Reproductive/Obstetrics negative OB ROS                             Anesthesia Physical Anesthesia Plan  ASA: III  Anesthesia Plan: General   Post-op Pain Management:    Induction: Intravenous  PONV Risk Score and Plan: Propofol infusion  Airway Management Planned: Natural Airway and Nasal Cannula  Additional Equipment:   Intra-op Plan:   Post-operative Plan:   Informed Consent: I have reviewed the patients History and Physical, chart, labs and discussed the procedure including the risks, benefits and alternatives for the proposed anesthesia with the patient or authorized representative who has indicated his/her understanding and acceptance.   Dental Advisory Given  Plan Discussed with: Anesthesiologist, CRNA and Surgeon  Anesthesia Plan  Comments: (Patient consented for risks of anesthesia including but not limited to:  - adverse reactions to medications - risk of intubation if required - damage to teeth, lips or other oral mucosa - sore throat or hoarseness - Damage to heart, brain, lungs or loss of life  Patient voiced understanding.)        Anesthesia Quick Evaluation

## 2017-05-31 NOTE — ED Notes (Signed)
[id number] [facility] -- [phone], call when has room number  Reita ClicheBobby, husband

## 2017-05-31 NOTE — Op Note (Addendum)
[facility] Patient Name: Kathryn Bryan Procedure Date: 05/31/2017 1:41 PM MRN: [id number] Account #: [id number] Date of Birth: Aug 28, 1960 Admit Type: Inpatient Age: 56 Room: [facility] ENDO ROOM 2 Gender: Female Note Status: Finalized Procedure:            Upper GI endoscopy Indications:          Epigastric abdominal pain, Abnormal CT of the GI tract,                        Nausea with vomiting Providers:            Lin Landsman MD, MD Referring MD:         Otho Bellows. Frederico Hamman, MD (Referring MD) Medicines:            Monitored Anesthesia Care Complications:        No immediate complications. Estimated blood loss:                        Minimal. Procedure:            Pre-Anesthesia Assessment:                       - Prior to the procedure, a History and Physical was                        performed, and patient medications and allergies were                        reviewed. The patient is competent. The risks and                        benefits of the procedure and the sedation options and                        risks were discussed with the patient. All questions                        were answered and informed consent was obtained.                        Patient identification and proposed procedure were                        verified by the physician, the nurse, the                        anesthesiologist, the anesthetist and the technician in                        the pre-procedure area in the procedure room. Mental                        Status Examination: alert and oriented. Airway                        Examination: normal oropharyngeal airway and neck                        mobility. Respiratory Examination: clear to  auscultation. CV Examination: normal. Prophylactic                        Antibiotics: The patient does not require prophylactic                        antibiotics. Prior Anticoagulants: The patient  has                        taken no previous anticoagulant or antiplatelet agents.                        ASA Grade Assessment: II - A patient with mild systemic                        disease. After reviewing the risks and benefits, the                        patient was deemed in satisfactory condition to undergo                        the procedure. The anesthesia plan was to use monitored                        anesthesia care (MAC). Immediately prior to                        administration of medications, the patient was                        re-assessed for adequacy to receive sedatives. The                        heart rate, respiratory rate, oxygen saturations, blood                        pressure, adequacy of pulmonary ventilation, and                        response to care were monitored throughout the                        procedure. The physical status of the patient was                        re-assessed after the procedure.                       After obtaining informed consent, the endoscope was                        passed under direct vision. Throughout the procedure,                        the patient's blood pressure, pulse, and oxygen                        saturations were monitored continuously. The                        Colonoscope was introduced  through the mouth, and                        advanced to the second part of duodenum. The upper GI                        endoscopy was accomplished without difficulty. The                        patient tolerated the procedure well. Findings:      The duodenal bulb and second portion of the duodenum were normal.      One large non-obstructing non-bleeding cratered gastric ulcer of       significant severity with multiple flat pigmented spots (Forrest Class       IIc) was found on the anterior wall of the gastric antrum and on the       greater curvature of the gastric antrum, occupying approximately 75% of        the circumference. The lesion was 30 mm in largest dimension. There is       no evidence of perforation. Biopsies were taken with a cold forceps for       histology to rule out malignancy.      The cardia, gastric fundus, gastric body and incisura were normal.       Biopsies were taken with a cold forceps for Helicobacter pylori testing.      The gastroesophageal junction and examined esophagus were normal. Impression:           - Normal duodenal bulb and second portion of the                        duodenum.                       - Non-obstructing non-bleeding gastric ulcer with a                        flat pigmented spot (Forrest Class IIc). There is no                        evidence of perforation. Biopsied.                       - Normal cardia, gastric fundus, gastric body and                        incisura. Biopsied.                       - Normal gastroesophageal junction and esophagus. Recommendation:       - Await pathology results to r/o malignancy.                       - Return patient to hospital ward for ongoing care.                       - Advance diet as tolerated.                       - Continue present medications.                       -  Avoid NSAIDs                       - Use Prilosec (omeprazole) 40 mg PO BID for 6 months.                       - Follow up in GI clinic in 2-3 weeks after discharge Procedure Code(s):    --- Professional ---                       [phone], Esophagogastroduodenoscopy, flexible, transoral;                        with biopsy, single or multiple Diagnosis Code(s):    --- Professional ---                       K25.9, Gastric ulcer, unspecified as acute or chronic,                        without hemorrhage or perforation                       R10.13, Epigastric pain                       R11.2, Nausea with vomiting, unspecified                       R93.3, Abnormal findings on diagnostic imaging of other                        parts of  digestive tract CPT copyright 2016 [employer]. All rights reserved. The codes documented in this report are preliminary and upon coder review may  be revised to meet current compliance requirements. Dr. Ulyess Mort Lin Landsman MD, MD 05/31/2017 2:07:14 PM This report has been signed electronically. Number of Addenda: 0 Note Initiated On: 05/31/2017 1:41 PM      [facility]

## 2017-05-31 NOTE — Consult Note (Signed)
Cephas Darby, MD [address]  [place]  [place],  [address]  Main: [phone]  Fax: [phone] Pager: [phone]   Consultation  Referring Provider:     No ref. provider found Primary Care Physician:  Laurell Josephs, MD Primary Gastroenterologist:      none     Reason for Consultation:     Epigastric pain, intractable nausea and vomiting  Date of Admission:  05/30/2017 Date of Consultation:  05/31/2017         HPI:   Kathryn Bryan is a 56 y.o. African-American female with no past medical history, hiatal hernia repair several years ago at [facility] presents with 3-4 month history of epigastric pain, 3 days history of intractable nausea and nonbloody emesis. She denies weight loss, loss of appetite. She denies melena, hematochezia or hematemesis. She reports that her stools are dark color. She does not have anemia at baseline. She is otherwise healthy. Her labs in the ER were normal. She had CT A/P with contrast which revealed tiny hypodensities in the liver, circumferential wall thickening of the antrum of the stomach and the pylorus. GI is consulted for further management  NSAIDs: None, denies using BC powder, goody powder   Antiplts/Anticoagulants/Anti thrombotics:  None  She denies family history of GI malignancy except father with colon cancer in 28s  GI Procedures:  Colonoscopy at [facility] 02/01/2014 Impression:- The entire examined colon is normal.  - The distal rectum and anal verge are normal on   retroflexion view.  - No specimens collected.   History reviewed. No pertinent past medical history.  Past Surgical History:  Procedure Laterality Date  . none      Prior to Admission medications   Not on File    Family History  Problem Relation Age of Onset  . Heart failure Mother      Social History   Tobacco Use  . Smoking status: Never Smoker  . Smokeless tobacco: Never  Used  Substance Use Topics  . Alcohol use: No    Frequency: Never  . Drug use: No    Allergies as of 05/30/2017 - Review Complete 05/30/2017  Allergen Reaction Noted  . Ibuprofen  05/30/2017    Review of Systems:    All systems reviewed and negative except where noted in HPI.   Physical Exam:  Vital signs in last 24 hours: Temp:  [96.9 F (36.1 C)-98.6 F (37 C)] 98.2 F (36.8 C) (12/10 1514) Pulse Rate:  [60-91] 88 (12/10 1514) Resp:  [10-23] 17 (12/10 1514) BP: (115-139)/(44-89) 122/68 (12/10 1514) SpO2:  [96 %-100 %] 98 % (12/10 1514) Weight:  [213 lb (96.6 kg)-220 lb (99.8 kg)] 213 lb (96.6 kg) (12/10 0419) Last BM Date: 05/30/17 General:   Pleasant, cooperative in NAD Head:  Normocephalic and atraumatic. Eyes:   No icterus.   Conjunctiva pink. PERRLA. Ears:  Normal auditory acuity. Neck:  Supple; no masses or thyroidomegaly Lungs: Respirations even and unlabored. Lungs clear to auscultation bilaterally.   No wheezes, crackles, or rhonchi.  Heart:  Regular rate and rhythm;  Without murmur, clicks, rubs or gallops Abdomen:  Soft, nondistended, epigastric tenderness. Normal bowel sounds. No appreciable masses or hepatomegaly.  No rebound or guarding.  Rectal:  Not performed. Msk:  Symmetrical without gross deformities.  Strength 5/5  Extremities:  Without edema, cyanosis or clubbing. Neurologic:  Alert and oriented x3;  grossly normal neurologically. Skin:  Intact without significant lesions or rashes. Cervical Nodes:  No significant  cervical adenopathy. Psych:  Alert and cooperative. Normal affect.  LAB RESULTS: CBC Latest Ref Rng & Units 05/31/2017 05/30/2017  WBC 3.6 - 11.0 K/uL 10.2 12.9(H)  Hemoglobin 12.0 - 16.0 g/dL 13.2 15.2  Hematocrit 35.0 - 47.0 % 40.4 46.5  Platelets 150 - 440 K/uL 293 372    BMET BMP Latest Ref Rng & Units 05/31/2017 05/30/2017  Glucose 65 - 99 mg/dL 116(H) 122(H)  BUN 6 - 20 mg/dL 16 19  Creatinine 0.44 - 1.00 mg/dL 0.80 0.94    Sodium 135 - 145 mmol/L 139 139  Potassium 3.5 - 5.1 mmol/L 3.3(L) 3.4(L)  Chloride 101 - 111 mmol/L 109 104  CO2 22 - 32 mmol/L 23 23  Calcium 8.9 - 10.3 mg/dL 8.1(L) 9.7    LFT Hepatic Function Latest Ref Rng & Units 05/30/2017  Total Protein 6.5 - 8.1 g/dL 8.1  Albumin 3.5 - 5.0 g/dL 4.0  AST 15 - 41 U/L 18  ALT 14 - 54 U/L 14  Alk Phosphatase 38 - 126 U/L 94  Total Bilirubin 0.3 - 1.2 mg/dL 0.7     STUDIES: Ct Abdomen Pelvis W Contrast  Result Date: 05/31/2017 CLINICAL DATA:  Acute onset of upper abdominal pain, nausea and vomiting. EXAM: CT ABDOMEN AND PELVIS WITH CONTRAST TECHNIQUE: Multidetector CT imaging of the abdomen and pelvis was performed using the standard protocol following bolus administration of intravenous contrast. CONTRAST:  183m ISOVUE-300 IOPAMIDOL (ISOVUE-300) INJECTION 61% COMPARISON:  None. FINDINGS: Lower chest: The visualized lung bases are grossly clear. The visualized portions of the mediastinum are unremarkable. Hepatobiliary: Tiny nonspecific hypodensities are noted within the liver, measuring up to 8 mm in size. The liver is otherwise unremarkable. The gallbladder is within normal limits. The common bile duct remains normal in caliber. Pancreas: The pancreas is within normal limits. Spleen: The spleen is unremarkable in appearance. Adrenals/Urinary Tract: The adrenal glands are unremarkable in appearance. The kidneys are within normal limits. There is no evidence of hydronephrosis. No renal or ureteral stones are identified. No perinephric stranding is seen. Stomach/Bowel: There is circumferential wall thickening at the antrum of the stomach and the pylorus. This may reflect an acute infectious or inflammatory process, though underlying mass cannot be excluded. The small bowel is within normal limits. The appendix is normal in caliber, without evidence of appendicitis. The colon is unremarkable in appearance. Vascular/Lymphatic: The abdominal aorta is  unremarkable in appearance. The inferior vena cava is grossly unremarkable. No retroperitoneal lymphadenopathy is seen. No pelvic sidewall lymphadenopathy is identified. Reproductive: The bladder is mildly distended and within normal limits. The uterus is grossly unremarkable in appearance. The ovaries are relatively symmetric. No suspicious adnexal masses are seen. Other: 3 small anterior abdominal wall hernias are noted at the mid abdomen, containing only fat. Musculoskeletal: No acute osseous abnormalities are identified. Mild facet disease is noted at the lower lumbar spine. The visualized musculature is unremarkable in appearance. IMPRESSION: 1. Circumferential wall thickening at the antrum of the stomach and the pylorus. This may reflect an acute infectious or inflammatory process, though underlying mass cannot be excluded. Would correlate with the patient's symptoms. Endoscopy is recommended for further evaluation, when and as deemed clinically appropriate. 2. Tiny nonspecific hypodensities within the liver measure up to 8 mm in size. 3. 3 small anterior abdominal wall hernias at the mid abdomen, containing only fat. Electronically Signed   By: JGarald BaldingM.D.   On: 05/31/2017 00:13      Impression / Plan:   Kathryn Bryan is a 56 y.o. black female with history of hiatal hernia repair, presents with 3-4 months history of epigastric pain, 3 days of intractable nausea and vomiting, CT showed antral wall thickening. GI consulted for further evaluation  Differentials include H. pylori gastritis or peptic ulcer disease or malignancy  - PPI twice a day - Nothing by mouth - EGD today  I have discussed alternative options, risks & benefits,  which include, but are not limited to, bleeding, infection, perforation,respiratory complication & drug reaction.  The patient agrees with this plan & written consent will be obtained.    Thank you for involving me in the care of this patient.      LOS: 0  days   Sherri Sear, MD  05/31/2017, 3:29 PM   Note: This dictation was prepared with Dragon dictation along with smaller phrase technology. Any transcriptional errors that result from this process are unintentional.

## 2017-06-01 ENCOUNTER — Observation Stay: Payer: BC Managed Care – PPO

## 2017-06-01 LAB — BASIC METABOLIC PANEL
Anion gap: 7 (ref 5–15)
BUN: 13 mg/dL (ref 6–20)
CALCIUM: 8.3 mg/dL — AB (ref 8.9–10.3)
CO2: 25 mmol/L (ref 22–32)
CREATININE: 1.04 mg/dL — AB (ref 0.44–1.00)
Chloride: 108 mmol/L (ref 101–111)
GFR calc Af Amer: >60 mL/min (ref >60)
GFR, EST NON AFRICAN AMERICAN: 59 mL/min — AB (ref >60)
GLUCOSE: 108 mg/dL — AB (ref 65–99)
Potassium: 3.6 mmol/L (ref 3.5–5.1)
SODIUM: 140 mmol/L (ref 135–145)

## 2017-06-01 LAB — CBC
HEMATOCRIT: 38.8 % (ref 35.0–47.0)
Hemoglobin: 12.9 g/dL (ref 12.0–16.0)
MCH: 28.5 pg (ref 26.0–34.0)
MCHC: 33.3 g/dL (ref 32.0–36.0)
MCV: 85.6 fL (ref 80.0–100.0)
PLATELETS: 283 10*3/uL (ref 150–440)
RBC: 4.53 MIL/uL (ref 3.80–5.20)
RDW: 14.7 % — AB (ref 11.5–14.5)
WBC: 6.5 10*3/uL (ref 3.6–11.0)

## 2017-06-01 LAB — HIV ANTIBODY (ROUTINE TESTING W REFLEX): HIV SCREEN 4TH GENERATION: NONREACTIVE

## 2017-06-01 MED ORDER — BISACODYL 10 MG RE SUPP
10.0000 mg | Freq: Every day | RECTAL | Status: DC | PRN
  Administered 2017-06-02: 10 mg via RECTAL
  Filled 2017-06-01: qty 1

## 2017-06-01 MED ORDER — HYDROCODONE-ACETAMINOPHEN 5-325 MG PO TABS: 1.0000 | ORAL_TABLET | ORAL | Status: DC | PRN

## 2017-06-01 MED ORDER — PANTOPRAZOLE SODIUM 40 MG PO TBEC: 40.0000 mg | DELAYED_RELEASE_TABLET | Freq: Two times a day (BID) | ORAL | 3 refills | Status: AC

## 2017-06-01 MED ORDER — SIMETHICONE 80 MG PO CHEW
160.0000 mg | CHEWABLE_TABLET | Freq: Three times a day (TID) | ORAL | Status: DC
  Administered 2017-06-01 – 2017-06-02 (×3): 160 mg via ORAL
  Filled 2017-06-01 (×6): qty 2

## 2017-06-01 MED ORDER — POLYETHYLENE GLYCOL 3350 17 G PO PACK
17.0000 g | PACK | Freq: Every day | ORAL | Status: DC
  Administered 2017-06-01: 17 g via ORAL
  Filled 2017-06-01 (×2): qty 1

## 2017-06-01 MED ORDER — SENNOSIDES-DOCUSATE SODIUM 8.6-50 MG PO TABS: 1.0000 | ORAL_TABLET | Freq: Every day | ORAL | 0 refills | Status: DC

## 2017-06-01 MED ORDER — SENNOSIDES-DOCUSATE SODIUM 8.6-50 MG PO TABS
1.0000 | ORAL_TABLET | Freq: Two times a day (BID) | ORAL | Status: DC
  Administered 2017-06-01 – 2017-06-02 (×3): 1 via ORAL
  Filled 2017-06-01 (×3): qty 1

## 2017-06-01 NOTE — Progress Notes (Signed)
[facility] [place] [facility]   PATIENT NAME: Kathryn Bryan    MR#:  [id number]  DATE OF BIRTH:  1961/03/01  SUBJECTIVE:  CHIEF COMPLAINT:   Chief Complaint  Patient presents with  . Abdominal Pain  . Emesis   -still having some abdominal pain, complains of gas and constipation  REVIEW OF SYSTEMS:  Review of Systems  Constitutional: Negative for chills, fever and malaise/fatigue.  HENT: Negative for congestion, ear discharge, hearing loss and nosebleeds.   Eyes: Negative for blurred vision and double vision.  Respiratory: Negative for cough, shortness of breath and wheezing.   Cardiovascular: Negative for chest pain, palpitations and leg swelling.  Gastrointestinal: Positive for abdominal pain, constipation, nausea and vomiting. Negative for diarrhea.  Genitourinary: Negative for dysuria.  Musculoskeletal: Negative for myalgias.  Neurological: Negative for dizziness, speech change, focal weakness, seizures and headaches.  Psychiatric/Behavioral: Negative for depression.    DRUG ALLERGIES:   Allergies  Allergen Reactions  . Ibuprofen     Pt states has ulcers    VITALS:  Blood pressure (!) 113/50, pulse 70, temperature 98.1 F (36.7 C), temperature source Oral, resp. rate 18, height 5\' 1"  (1.549 m), weight 96.6 kg (213 lb), SpO2 97 %.  PHYSICAL EXAMINATION:  Physical Exam  GENERAL:  56 y.o.-year-old patient lying in the bed with no acute distress.  EYES: Pupils equal, round, reactive to light and accommodation. No scleral icterus. Extraocular muscles intact.  HEENT: Head atraumatic, normocephalic. Oropharynx and nasopharynx clear.  NECK:  Supple, no jugular venous distention. No thyroid enlargement, no tenderness.  LUNGS: Normal breath sounds bilaterally, no wheezing, rales,rhonchi or crepitation. No use of accessory muscles of respiration.  CARDIOVASCULAR: S1, S2 normal. No murmurs, rubs, or gallops.  ABDOMEN: Soft, nontender except minimal  epigastric tenderness., nondistended. Bowel sounds present. No organomegaly or mass.  EXTREMITIES: No pedal edema, cyanosis, or clubbing.  NEUROLOGIC: Cranial nerves II through XII are intact. Muscle strength 5/5 in all extremities. Sensation intact. Gait not checked.  PSYCHIATRIC: The patient is alert and oriented x 3.  SKIN: No obvious rash, lesion, or ulcer.    LABORATORY PANEL:   CBC Recent Labs  Lab 06/01/17 0410  WBC 6.5  HGB 12.9  HCT 38.8  PLT 283   ------------------------------------------------------------------------------------------------------------------  Chemistries  Recent Labs  Lab 05/30/17 2235  06/01/17 0410  NA 139   < > 140  K 3.4*   < > 3.6  CL 104   < > 108  CO2 23   < > 25  GLUCOSE 122*   < > 108*  BUN 19   < > 13  CREATININE 0.94   < > 1.04*  CALCIUM 9.7   < > 8.3*  AST 18  --   --   ALT 14  --   --   ALKPHOS 94  --   --   BILITOT 0.7  --   --    < > = values in this interval not displayed.   ------------------------------------------------------------------------------------------------------------------  Cardiac Enzymes Recent Labs  Lab 05/30/17 2235  TROPONINI <0.03   ------------------------------------------------------------------------------------------------------------------  RADIOLOGY:  Ct Abdomen Pelvis W Contrast  Result Date: 05/31/2017 CLINICAL DATA:  Acute onset of upper abdominal pain, nausea and vomiting. EXAM: CT ABDOMEN AND PELVIS WITH CONTRAST TECHNIQUE: Multidetector CT imaging of the abdomen and pelvis was performed using the standard protocol following bolus administration of intravenous contrast. CONTRAST:  100mL ISOVUE-300 IOPAMIDOL (ISOVUE-300) INJECTION 61% COMPARISON:  None. FINDINGS: Lower chest: The  visualized lung bases are grossly clear. The visualized portions of the mediastinum are unremarkable. Hepatobiliary: Tiny nonspecific hypodensities are noted within the liver, measuring up to 8 mm in size. The  liver is otherwise unremarkable. The gallbladder is within normal limits. The common bile duct remains normal in caliber. Pancreas: The pancreas is within normal limits. Spleen: The spleen is unremarkable in appearance. Adrenals/Urinary Tract: The adrenal glands are unremarkable in appearance. The kidneys are within normal limits. There is no evidence of hydronephrosis. No renal or ureteral stones are identified. No perinephric stranding is seen. Stomach/Bowel: There is circumferential wall thickening at the antrum of the stomach and the pylorus. This may reflect an acute infectious or inflammatory process, though underlying mass cannot be excluded. The small bowel is within normal limits. The appendix is normal in caliber, without evidence of appendicitis. The colon is unremarkable in appearance. Vascular/Lymphatic: The abdominal aorta is unremarkable in appearance. The inferior vena cava is grossly unremarkable. No retroperitoneal lymphadenopathy is seen. No pelvic sidewall lymphadenopathy is identified. Reproductive: The bladder is mildly distended and within normal limits. The uterus is grossly unremarkable in appearance. The ovaries are relatively symmetric. No suspicious adnexal masses are seen. Other: 3 small anterior abdominal wall hernias are noted at the mid abdomen, containing only fat. Musculoskeletal: No acute osseous abnormalities are identified. Mild facet disease is noted at the lower lumbar spine. The visualized musculature is unremarkable in appearance. IMPRESSION: 1. Circumferential wall thickening at the antrum of the stomach and the pylorus. This may reflect an acute infectious or inflammatory process, though underlying mass cannot be excluded. Would correlate with the patient's symptoms. Endoscopy is recommended for further evaluation, when and as deemed clinically appropriate. 2. Tiny nonspecific hypodensities within the liver measure up to 8 mm in size. 3. 3 small anterior abdominal wall  hernias at the mid abdomen, containing only fat. Electronically Signed   By: Roanna RaiderJeffery  Chang M.D.   On: 05/31/2017 00:13    EKG:   Orders placed or performed during the hospital encounter of 05/30/17  . EKG 12-Lead  . EKG 12-Lead  . ED EKG  . ED EKG    ASSESSMENT AND PLAN:   56 year old female with no significant past medical history presenting with epigastric pain associated with nausea and vomiting.  Going on for a few months now, but much worse in the last 4 days.  1.  Epigastric pain with nausea, vomiting-CT of the abdomen showing gastric antral wall thickening, no obstruction noted. -GI consult appreciated and EGD showing large cratered antral ulcer- biopsy taken -tolerating diet well - protonix iV bid -constipated today  2.  Hypokalemia-replaced  3.  DVT prophylaxis- encourage ambulation  4. Constipation- meds added  All the records are reviewed and case discussed with Care Management/Social Workerr. Management plans discussed with the patient, family and they are in agreement.  CODE STATUS: Full code  TOTAL TIME TAKING CARE OF THIS PATIENT: 36 minutes.   POSSIBLE D/C tomorrowDEPENDING ON CLINICAL CONDITION.   Enid BaasKALISETTI,Efrata Brunner M.D on 06/01/2017 at 5:10 PM  Between 7am to 6pm - Pager - [phone]  After 6pm go to [email or web address] - Social research officer, governmentpassword EPAS [facility]  [facility]  Office  [phone]  CC: Primary care physician; Charlies ConstableSpencer, Donald, MD

## 2017-06-01 NOTE — Plan of Care (Signed)
Pt progressing. Pt has still not had Bm. Kalisetti made aware. Possible d/c tomorrow

## 2017-06-01 NOTE — Progress Notes (Signed)
Subjective: Patient seen for f/u gastric ulcer. Patient has mild continued abdominal pain, tolerating po diet.  Objective: Vital signs in last 24 hours: Temp:  [97.4 F (36.3 C)-98.4 F (36.9 C)] 98.1 F (36.7 C) (12/11 1351) Pulse Rate:  [57-70] 70 (12/11 1351) Resp:  [18-20] 18 (12/11 1351) BP: (104-113)/(48-51) 113/50 (12/11 1351) SpO2:  [97 %-100 %] 97 % (12/11 1351) Blood pressure (!) 113/50, pulse 70, temperature 98.1 F (36.7 C), temperature source Oral, resp. rate 18, height 5\' 1"  (1.549 m), weight 96.6 kg (213 lb), SpO2 97 %.   Intake/Output from previous day: 12/10 0701 - 12/11 0700 In: 1865.3 [P.O.:19; I.V.:1846.3] Out: 300 [Urine:300]  Intake/Output this shift: No intake/output data recorded.   General appearance:  Alert, NAD Resp: CTA, no wheezes Cardio:  RR nl S1, S2 GI:  Abdomen mildly tender, BS+ Extremities:  No edema.   Lab Results: Results for orders placed or performed during the hospital encounter of 05/30/17 (from the past 24 hour(s))  Basic metabolic panel     Status: Abnormal   Collection Time: 06/01/17  4:10 AM  Result Value Ref Range   Sodium 140 135 - 145 mmol/L   Potassium 3.6 3.5 - 5.1 mmol/L   Chloride 108 101 - 111 mmol/L   CO2 25 22 - 32 mmol/L   Glucose, Bld 108 (H) 65 - 99 mg/dL   BUN 13 6 - 20 mg/dL   Creatinine, Ser 1.611.04 (H) 0.44 - 1.00 mg/dL   Calcium 8.3 (L) 8.9 - 10.3 mg/dL   GFR calc non Af Amer 59 (L) >60 mL/min   GFR calc Af Amer >60 >60 mL/min   Anion gap 7 5 - 15  CBC     Status: Abnormal   Collection Time: 06/01/17  4:10 AM  Result Value Ref Range   WBC 6.5 3.6 - 11.0 K/uL   RBC 4.53 3.80 - 5.20 MIL/uL   Hemoglobin 12.9 12.0 - 16.0 g/dL   HCT 09.638.8 04.535.0 - 40.947.0 %   MCV 85.6 80.0 - 100.0 fL   MCH 28.5 26.0 - 34.0 pg   MCHC 33.3 32.0 - 36.0 g/dL   RDW 81.114.7 (H) 91.411.5 - 78.214.5 %   Platelets 283 150 - 440 K/uL     Recent Labs    05/30/17 2235 05/31/17 0614 06/01/17 0410  WBC 12.9* 10.2 6.5  HGB 15.2 13.2 12.9  HCT  46.5 40.4 38.8  PLT 372 293 283   BMET Recent Labs    05/30/17 2235 05/31/17 0614 06/01/17 0410  NA 139 139 140  K 3.4* 3.3* 3.6  CL 104 109 108  CO2 23 23 25   GLUCOSE 122* 116* 108*  BUN 19 16 13   CREATININE 0.94 0.80 1.04*  CALCIUM 9.7 8.1* 8.3*   LFT Recent Labs    05/30/17 2235  PROT 8.1  ALBUMIN 4.0  AST 18  ALT 14  ALKPHOS 94  BILITOT 0.7   PT/INR No results for input(s): LABPROT, INR in the last 72 hours. Hepatitis Panel No results for input(s): HEPBSAG, HCVAB, HEPAIGM, HEPBIGM in the last 72 hours. C-Diff No results for input(s): CDIFFTOX in the last 72 hours. No results for input(s): CDIFFPCR in the last 72 hours.   Studies/Results: Ct Abdomen Pelvis W Contrast  Result Date: 05/31/2017 CLINICAL DATA:  Acute onset of upper abdominal pain, nausea and vomiting. EXAM: CT ABDOMEN AND PELVIS WITH CONTRAST TECHNIQUE: Multidetector CT imaging of the abdomen and pelvis was performed using the standard protocol following bolus  administration of intravenous contrast. CONTRAST:  100mL ISOVUE-300 IOPAMIDOL (ISOVUE-300) INJECTION 61% COMPARISON:  None. FINDINGS: Lower chest: The visualized lung bases are grossly clear. The visualized portions of the mediastinum are unremarkable. Hepatobiliary: Tiny nonspecific hypodensities are noted within the liver, measuring up to 8 mm in size. The liver is otherwise unremarkable. The gallbladder is within normal limits. The common bile duct remains normal in caliber. Pancreas: The pancreas is within normal limits. Spleen: The spleen is unremarkable in appearance. Adrenals/Urinary Tract: The adrenal glands are unremarkable in appearance. The kidneys are within normal limits. There is no evidence of hydronephrosis. No renal or ureteral stones are identified. No perinephric stranding is seen. Stomach/Bowel: There is circumferential wall thickening at the antrum of the stomach and the pylorus. This may reflect an acute infectious or inflammatory  process, though underlying mass cannot be excluded. The small bowel is within normal limits. The appendix is normal in caliber, without evidence of appendicitis. The colon is unremarkable in appearance. Vascular/Lymphatic: The abdominal aorta is unremarkable in appearance. The inferior vena cava is grossly unremarkable. No retroperitoneal lymphadenopathy is seen. No pelvic sidewall lymphadenopathy is identified. Reproductive: The bladder is mildly distended and within normal limits. The uterus is grossly unremarkable in appearance. The ovaries are relatively symmetric. No suspicious adnexal masses are seen. Other: 3 small anterior abdominal wall hernias are noted at the mid abdomen, containing only fat. Musculoskeletal: No acute osseous abnormalities are identified. Mild facet disease is noted at the lower lumbar spine. The visualized musculature is unremarkable in appearance. IMPRESSION: 1. Circumferential wall thickening at the antrum of the stomach and the pylorus. This may reflect an acute infectious or inflammatory process, though underlying mass cannot be excluded. Would correlate with the patient's symptoms. Endoscopy is recommended for further evaluation, when and as deemed clinically appropriate. 2. Tiny nonspecific hypodensities within the liver measure up to 8 mm in size. 3. 3 small anterior abdominal wall hernias at the mid abdomen, containing only fat. Electronically Signed   By: Roanna RaiderJeffery  Chang M.D.   On: 05/31/2017 00:13    Scheduled Inpatient Medications:   . pantoprazole (PROTONIX) IV  40 mg Intravenous Q12H  . polyethylene glycol  17 g Oral Daily  . senna-docusate  1 tablet Oral BID  . simethicone  160 mg Oral TID    Continuous Inpatient Infusions:    PRN Inpatient Medications:  acetaminophen **OR** acetaminophen, bisacodyl, HYDROcodone-acetaminophen, morphine injection, ondansetron **OR** ondansetron (ZOFRAN) IV  Miscellaneous:   Assessment:  1. Gastric ulcer s/p biopsy. Stable,  no bleeding.   Plan:  1. Continue diet. 2. Discharge home tomorrow AM ok from a GI standpoint.  3. Follow up DR. Vanga 3-4 weeks.  Olympia Adelsberger K. Norma Fredricksonoledo, M.D. 06/01/2017, 7:13 PM

## 2017-06-02 MED ORDER — FLEET ENEMA 7-19 GM/118ML RE ENEM: 1.0000 | ENEMA | Freq: Every day | RECTAL | Status: DC | PRN

## 2017-06-02 MED ORDER — LACTULOSE 10 GM/15ML PO SOLN
20.0000 g | Freq: Once | ORAL | Status: AC
  Administered 2017-06-02: 20 g via ORAL
  Filled 2017-06-02: qty 30

## 2017-06-02 MED ORDER — PANTOPRAZOLE SODIUM 40 MG PO TBEC
40.0000 mg | DELAYED_RELEASE_TABLET | Freq: Two times a day (BID) | ORAL | Status: DC
  Administered 2017-06-02: 40 mg via ORAL
  Filled 2017-06-02: qty 1

## 2017-06-02 MED ORDER — POLYETHYLENE GLYCOL 3350 17 G PO PACK: 17.0000 g | PACK | Freq: Every day | ORAL | 0 refills | Status: DC

## 2017-06-02 NOTE — Progress Notes (Signed)
Kathryn Bryan to be D/C'd Home per MD order.  Discussed prescriptions and follow up appointments with the patient. Prescriptions given to patient, medication list explained in detail. Pt verbalized understanding.  Allergies as of 06/02/2017      Reactions   Ibuprofen    Pt states has ulcers      Medication List    TAKE these medications   pantoprazole 40 MG tablet Commonly known as:  PROTONIX Take 1 tablet (40 mg total) by mouth 2 (two) times daily before a meal.   polyethylene glycol packet Commonly known as:  MIRALAX / GLYCOLAX Take 17 g by mouth daily. Start taking on:  06/03/2017   senna-docusate 8.6-50 MG tablet Commonly known as:  Senokot-S Take 1 tablet by mouth at bedtime.       Vitals:   06/02/17 0439 06/02/17 1247  BP: (!) 113/52 (!) 116/53  Pulse: 68 63  Resp: 18   Temp: 98.2 F (36.8 C) 98 F (36.7 C)  SpO2: 100% 100%    Skin clean, dry and intact without evidence of skin break down, no evidence of skin tears noted. IV catheter discontinued intact. Site without signs and symptoms of complications. Dressing and pressure applied. Pt denies pain at this time. No complaints noted.  An After Visit Summary was printed and given to the patient. Patient escorted via WC, and D/C home via private auto.  Kathryn Bryan

## 2017-06-03 LAB — SURGICAL PATHOLOGY

## 2017-06-05 NOTE — Discharge Summary (Signed)
[facility] [place] [facility]   PATIENT NAME: Kathryn Bryan    MR#:  [id number]  DATE OF BIRTH:  02-14-1961  DATE OF ADMISSION:  05/30/2017   ADMITTING PHYSICIAN: Ihor AustinPavan Pyreddy, MD  DATE OF DISCHARGE: 06/02/2017  6:08 PM  PRIMARY CARE PHYSICIAN: Charlies ConstableSpencer, Donald, MD   ADMISSION DIAGNOSIS:   Pain of upper abdomen [R10.10] Non-intractable vomiting with nausea, unspecified vomiting type [R11.2]  DISCHARGE DIAGNOSIS:   Active Problems:   Nausea & vomiting Acute gastritis and gastric Ulcer  SECONDARY DIAGNOSIS:   History reviewed. No pertinent past medical history.  HOSPITAL COURSE:   56 year old female with no significant past medical history presenting with epigastric pain associated with nausea and vomiting.  Going on for a few months now, but much worse in the last 4 days.  1.  Epigastric pain with nausea, vomiting-CT of the abdomen showing gastric antral wall thickening, no obstruction noted. -GI consult appreciated and EGD showing large cratered antral ulcer- biopsy taken and it did not show any malignancy. Showing signs of chronic gastritis. -Patient's diet was advanced and she was tolerating well. -Advised to avoid aspirin and NSAIDs. -Discharged on Protonix twice a day for now and will have GI follow-up and may be a repeat EGD as outpatient.  2.  Hypokalemia-replaced  3. Constipation- complains of abdominal pain, KUB showing stool. Started on laxatives which resolved her symptoms. Advised to take stool softeners and MiraLAX as outpatient.  Stable for discharge     DISCHARGE CONDITIONS:   Guarded  CONSULTS OBTAINED:   Treatment Team:  Toney ReilVanga, Rohini Reddy, MD  DRUG ALLERGIES:   Allergies  Allergen Reactions  . Ibuprofen     Pt states has ulcers   DISCHARGE MEDICATIONS:   Allergies as of 06/02/2017      Reactions   Ibuprofen    Pt states has ulcers      Medication List    TAKE these medications   pantoprazole 40  MG tablet Commonly known as:  PROTONIX Take 1 tablet (40 mg total) by mouth 2 (two) times daily before a meal.   polyethylene glycol packet Commonly known as:  MIRALAX / GLYCOLAX Take 17 g by mouth daily.   senna-docusate 8.6-50 MG tablet Commonly known as:  Senokot-S Take 1 tablet by mouth at bedtime.        DISCHARGE INSTRUCTIONS:   1. GI f/u in 1-2 weeks  DIET:   Regular diet  ACTIVITY:   Activity as tolerated  OXYGEN:   Home Oxygen: No.  Oxygen Delivery: room air  DISCHARGE LOCATION:   home   If you experience worsening of your admission symptoms, develop shortness of breath, life threatening emergency, suicidal or homicidal thoughts you must seek medical attention immediately by calling 911 or calling your MD immediately  if symptoms less severe.  You Must read complete instructions/literature along with all the possible adverse reactions/side effects for all the Medicines you take and that have been prescribed to you. Take any new Medicines after you have completely understood and accpet all the possible adverse reactions/side effects.   Please note  You were cared for by a hospitalist during your hospital stay. If you have any questions about your discharge medications or the care you received while you were in the hospital after you are discharged, you can call the unit and asked to speak with the hospitalist on call if the hospitalist that took care of you is not available. Once you are discharged, your primary  care physician will handle any further medical issues. Please note that NO REFILLS for any discharge medications will be authorized once you are discharged, as it is imperative that you return to your primary care physician (or establish a relationship with a primary care physician if you do not have one) for your aftercare needs so that they can reassess your need for medications and monitor your lab values.    On the day of Discharge:  VITAL SIGNS:    Blood pressure (!) 116/53, pulse 63, temperature 98 F (36.7 C), temperature source Oral, resp. rate 18, height 5\' 1"  (1.549 m), weight 96.6 kg (213 lb), SpO2 100 %.  PHYSICAL EXAMINATION:    GENERAL:  56 y.o.-year-old patient lying in the bed with no acute distress.  EYES: Pupils equal, round, reactive to light and accommodation. No scleral icterus. Extraocular muscles intact.  HEENT: Head atraumatic, normocephalic. Oropharynx and nasopharynx clear.  NECK:  Supple, no jugular venous distention. No thyroid enlargement, no tenderness.  LUNGS: Normal breath sounds bilaterally, no wheezing, rales,rhonchi or crepitation. No use of accessory muscles of respiration.  CARDIOVASCULAR: S1, S2 normal. No murmurs, rubs, or gallops.  ABDOMEN: Soft, nontender except minimal epigastric tenderness., nondistended. Bowel sounds present. No organomegaly or mass.  EXTREMITIES: No pedal edema, cyanosis, or clubbing.  NEUROLOGIC: Cranial nerves II through XII are intact. Muscle strength 5/5 in all extremities. Sensation intact. Gait not checked.  PSYCHIATRIC: The patient is alert and oriented x 3.  SKIN: No obvious rash, lesion, or ulcer.    DATA REVIEW:   CBC Recent Labs  Lab 06/01/17 0410  WBC 6.5  HGB 12.9  HCT 38.8  PLT 283    Chemistries  Recent Labs  Lab 05/30/17 2235  06/01/17 0410  NA 139   < > 140  K 3.4*   < > 3.6  CL 104   < > 108  CO2 23   < > 25  GLUCOSE 122*   < > 108*  BUN 19   < > 13  CREATININE 0.94   < > 1.04*  CALCIUM 9.7   < > 8.3*  AST 18  --   --   ALT 14  --   --   ALKPHOS 94  --   --   BILITOT 0.7  --   --    < > = values in this interval not displayed.     Microbiology Results  No results found for this or any previous visit.  RADIOLOGY:  No results found.   Management plans discussed with the patient, family and they are in agreement.  CODE STATUS:  Code Status History    Date Active Date Inactive Code Status Order ID Comments User Context    05/31/2017 04:31 06/02/2017 21:13 Full Code [id number]  Ihor AustinPyreddy, Pavan, MD Inpatient      TOTAL TIME TAKING CARE OF THIS PATIENT: 38 minutes.    Enid BaasKALISETTI,Taylin Mans M.D on 06/05/2017 at 12:39 PM  Between 7am to 6pm - Pager - [phone]  After 6pm go to [email or web address] - Social research officer, [email or web address] [email or web address]  [facility] [place] [facility]  Office  [phone]  CC: Primary care physician; Charlies ConstableSpencer, Donald, MD   Note: This dictation was prepared with Dragon dictation along with smaller phrase technology. Any transcriptional errors that result from this process are unintentional.

## 2017-06-16 ENCOUNTER — Encounter: Payer: Self-pay | Admitting: Gastroenterology

## 2017-06-16 ENCOUNTER — Other Ambulatory Visit: Payer: Self-pay

## 2017-06-16 ENCOUNTER — Other Ambulatory Visit
Admission: RE | Admit: 2017-06-16 | Discharge: 2017-06-16 | Disposition: A | Discharge status: Home / Self Care | Payer: BC Managed Care – PPO | Source: Ambulatory Visit | Attending: Gastroenterology | Admitting: Gastroenterology

## 2017-06-16 ENCOUNTER — Ambulatory Visit (INDEPENDENT_AMBULATORY_CARE_PROVIDER_SITE_OTHER): Payer: BC Managed Care – PPO | Admitting: Gastroenterology

## 2017-06-16 VITALS — BP 124/74 | HR 72 | Temp 97.7°F | Ht 61.0 in | Wt 214.0 lb

## 2017-06-16 DIAGNOSIS — K259 Gastric ulcer, unspecified as acute or chronic, without hemorrhage or perforation: Secondary | ICD-10-CM

## 2017-06-16 DIAGNOSIS — K769 Liver disease, unspecified: Secondary | ICD-10-CM

## 2017-06-16 NOTE — Progress Notes (Unsigned)
mr

## 2017-06-16 NOTE — Progress Notes (Signed)
Wyline MoodKiran Megin Consalvo MD, MRCP(U.K) [address]  [address]  [place], [place] [address]  Main: [phone]  Fax: [phone]   Primary Care Physician: Charlies ConstableSpencer, Donald, MD  Primary Gastroenterologist:  Dr.Vanga   Chief Complaint  Patient presents with  . hospital follow up  abdominal pain , nausea    nausea has improved still experiencing some abd pain it not as bad as it was    HPI: Kathryn Bryan is a 56 y.o. female   Summary of history :  She is here today for hospital follow-up.  She was admitted to the hospital on 05/30/2017 for epigastric pain, intractable nausea vomiting.  She was seen by my colleague she was admitted with a 3-day prior history of nausea and emesis.  She underwent a CT scan of the abdomen and pelvis with contrast which demonstrated a circumferential wall thickening of the antrum of the stomach and pylorus.  There are also 2 tiny hypodensities within the liver measuring up to 8 mm in size.  She underwent an upper endoscopy on 05/31/2017 with Dr Allegra LaiVanga which demonstrated a large ulcer in the antrum of the stomach which was not bleeding.  Biopsies were taken which demonstrated moderate chronic active gastritis and ulceration and focally increased eosinophils.  Random gastric biopsies did not demonstrate eosinophils was minimal chronic gastritis negative for H. pylori.  She had a CBC on 06/01/2017 which demonstrated a hemoglobin of 12.9 g.  Interval history 05/31/2017 to 06/16/2017   Explained the results of the biopsies.  At the time of discharge was started on a PPI.Denies any prior NSAID use.   Presently she says has no nausea, still has some discomfort, gradually getting better Taking Protonix 40 mg twice daily . Not a smoker.    Current Outpatient Medications  Medication Sig Dispense Refill  . pantoprazole (PROTONIX) 40 MG tablet Take 1 tablet (40 mg total) by mouth 2 (two) times daily before a meal. 60 tablet 3  . polyethylene glycol (MIRALAX / GLYCOLAX)  packet Take 17 g by mouth daily. 14 each 0  . senna-docusate (SENOKOT-S) 8.6-50 MG tablet Take 1 tablet by mouth at bedtime. 30 tablet 0   No current facility-administered medications for this visit.     Allergies as of 06/16/2017 - Review Complete 06/16/2017  Allergen Reaction Noted  . Ibuprofen  05/30/2017    ROS:  General: Negative for anorexia, weight loss, fever, chills, fatigue, weakness. ENT: Negative for hoarseness, difficulty swallowing , nasal congestion. CV: Negative for chest pain, angina, palpitations, dyspnea on exertion, peripheral edema.  Respiratory: Negative for dyspnea at rest, dyspnea on exertion, cough, sputum, wheezing.  GI: See history of present illness. GU:  Negative for dysuria, hematuria, urinary incontinence, urinary frequency, nocturnal urination.  Endo: Negative for unusual weight change.    Physical Examination:   BP 124/74   Pulse 72   Temp 97.7 F (36.5 C) (Oral)   Ht 5\' 1"  (1.549 m)   Wt 214 lb (97.1 kg)   BMI 40.43 kg/m   General: Well-nourished, well-developed in no acute distress.  Eyes: No icterus. Conjunctivae pink. Mouth: Oropharyngeal mucosa moist and pink , no lesions erythema or exudate. Lungs: Clear to auscultation bilaterally. Non-labored. Heart: Regular rate and rhythm, no murmurs rubs or gallops.  Abdomen: Bowel sounds are normal, nontender, nondistended, no hepatosplenomegaly or masses, no abdominal bruits or hernia , no rebound or guarding.   Extremities: No lower extremity edema. No clubbing or deformities. Neuro: Alert and oriented x 3.  Grossly  intact. Skin: Warm and dry, no jaundice.   Psych: Alert and cooperative, normal mood and affect.   Imaging Studies: Dg Abd 1 View  Result Date: 06/01/2017 CLINICAL DATA:  Abdominal pain.  No recent bowel movement. EXAM: ABDOMEN - 1 VIEW COMPARISON:  CT abdomen pelvis 05/30/2017 FINDINGS: There is a large amount of stool in the colon, including mildly hyperdense transverse  colonic stool. No dilated small bowel. IMPRESSION: Large amount of stool in the colon. Electronically Signed   By: Deatra RobinsonKevin  Herman M.D.   On: 06/01/2017 19:30   Ct Abdomen Pelvis W Contrast  Result Date: 05/31/2017 CLINICAL DATA:  Acute onset of upper abdominal pain, nausea and vomiting. EXAM: CT ABDOMEN AND PELVIS WITH CONTRAST TECHNIQUE: Multidetector CT imaging of the abdomen and pelvis was performed using the standard protocol following bolus administration of intravenous contrast. CONTRAST:  100mL ISOVUE-300 IOPAMIDOL (ISOVUE-300) INJECTION 61% COMPARISON:  None. FINDINGS: Lower chest: The visualized lung bases are grossly clear. The visualized portions of the mediastinum are unremarkable. Hepatobiliary: Tiny nonspecific hypodensities are noted within the liver, measuring up to 8 mm in size. The liver is otherwise unremarkable. The gallbladder is within normal limits. The common bile duct remains normal in caliber. Pancreas: The pancreas is within normal limits. Spleen: The spleen is unremarkable in appearance. Adrenals/Urinary Tract: The adrenal glands are unremarkable in appearance. The kidneys are within normal limits. There is no evidence of hydronephrosis. No renal or ureteral stones are identified. No perinephric stranding is seen. Stomach/Bowel: There is circumferential wall thickening at the antrum of the stomach and the pylorus. This may reflect an acute infectious or inflammatory process, though underlying mass cannot be excluded. The small bowel is within normal limits. The appendix is normal in caliber, without evidence of appendicitis. The colon is unremarkable in appearance. Vascular/Lymphatic: The abdominal aorta is unremarkable in appearance. The inferior vena cava is grossly unremarkable. No retroperitoneal lymphadenopathy is seen. No pelvic sidewall lymphadenopathy is identified. Reproductive: The bladder is mildly distended and within normal limits. The uterus is grossly unremarkable in  appearance. The ovaries are relatively symmetric. No suspicious adnexal masses are seen. Other: 3 small anterior abdominal wall hernias are noted at the mid abdomen, containing only fat. Musculoskeletal: No acute osseous abnormalities are identified. Mild facet disease is noted at the lower lumbar spine. The visualized musculature is unremarkable in appearance. IMPRESSION: 1. Circumferential wall thickening at the antrum of the stomach and the pylorus. This may reflect an acute infectious or inflammatory process, though underlying mass cannot be excluded. Would correlate with the patient's symptoms. Endoscopy is recommended for further evaluation, when and as deemed clinically appropriate. 2. Tiny nonspecific hypodensities within the liver measure up to 8 mm in size. 3. 3 small anterior abdominal wall hernias at the mid abdomen, containing only fat. Electronically Signed   By: Roanna RaiderJeffery  Chang M.D.   On: 05/31/2017 00:13    Assessment and Plan:   Kathryn Bryan is a 56 y.o. y/o female here to follow up to  recent hospital admission for epigastric pain nausea vomiting.  CT scan of the abdomen demonstrated tiny lesions in the liver which needs to be further evaluated.  She underwent an upper endoscopy demonstrated a large 3 cm nonbleeding gastric ulcer biopsies of which showed no malignancy.  I did explain that biopsies only indicate a small area of the ulcer not representative of the whole lesion and hence is still a small tiny chance that malignancy could be present.    Plan :  I would suggest that we repeat the upper endoscopy in about 6 weeks time after adequate proton pump inhibitor therapy to help aid healing of the ulcer.    We will also check her H. pylori stool antigen.    In terms of the abnormal lesions of the liver I would suggest to get an MRI liver mass protocol to evaluate further.  In the meanwhile it suggest absolutely no NSAID usage.   I have discussed alternative options, risks &  benefits,  which include, but are not limited to, bleeding, infection, perforation,respiratory complication & drug reaction.  The patient agrees with this plan & written consent will be obtained.   Dr [name]  MD,MRCP [facility]) Follow up with Dr Allegra Lai in 10 weeks

## 2017-06-16 NOTE — Patient Instructions (Signed)
During todays visit with Dr. [name] he has advised the following:  1. [facility] for labs to be completed. 2. Upper Endoscopy to be scheduled in 8 weeks. 3. Nurse to contact you to schedule MRE Liver. 4. Follow up with Dr. Allegra LaiVanga in 10 weeks.

## 2017-06-17 ENCOUNTER — Telehealth: Payer: Self-pay

## 2017-06-17 ENCOUNTER — Other Ambulatory Visit: Payer: Self-pay

## 2017-06-17 DIAGNOSIS — K769 Liver disease, unspecified: Secondary | ICD-10-CM

## 2017-06-17 NOTE — Telephone Encounter (Signed)
Pt has been scheduled for MR Enterogram  On Friday January 4 at 9 am. Advised nothing to eat or drink 4 hours prior to the procedure and if she needs to reschedule contact [phone]. This appt information has been left on her voicemail.  Thanks [employer]

## 2017-06-18 ENCOUNTER — Other Ambulatory Visit: Payer: Self-pay

## 2017-06-18 DIAGNOSIS — K769 Liver disease, unspecified: Secondary | ICD-10-CM

## 2017-06-19 DIAGNOSIS — K259 Gastric ulcer, unspecified as acute or chronic, without hemorrhage or perforation: Secondary | ICD-10-CM | POA: Diagnosis not present

## 2017-06-25 ENCOUNTER — Telehealth: Payer: Self-pay

## 2017-06-25 ENCOUNTER — Ambulatory Visit: Payer: BC Managed Care – PPO

## 2017-06-25 LAB — H. PYLORI ANTIGEN, STOOL: H. PYLORI STOOL AG, EIA: NEGATIVE

## 2017-06-25 NOTE — Telephone Encounter (Signed)
LVM on both numbers for patient callback for results per Dr. [name].    - H pylori stool antigen negative

## 2017-06-28 ENCOUNTER — Telehealth: Payer: Self-pay | Admitting: Gastroenterology

## 2017-06-28 NOTE — Telephone Encounter (Signed)
Patient is returning your call for results. Please call her at work

## 2017-06-28 NOTE — Telephone Encounter (Signed)
Patient has been informed her H-Pylori lab came back negative.  She will call us to schedule her EGD when she looks at her work schedule.  Thanks [employer]

## 2017-07-02 ENCOUNTER — Ambulatory Visit
Admission: RE | Admit: 2017-07-02 | Discharge: 2017-07-02 | Disposition: A | Discharge status: Home / Self Care | Payer: BC Managed Care – PPO | Source: Ambulatory Visit | Attending: Gastroenterology | Admitting: Gastroenterology

## 2017-07-02 DIAGNOSIS — K769 Liver disease, unspecified: Secondary | ICD-10-CM | POA: Diagnosis present

## 2017-07-02 DIAGNOSIS — K7689 Other specified diseases of liver: Secondary | ICD-10-CM | POA: Diagnosis not present

## 2017-07-02 MED ORDER — GADOBENATE DIMEGLUMINE 529 MG/ML IV SOLN
20.0000 mL | Freq: Once | INTRAVENOUS | Status: AC | PRN
  Administered 2017-07-02: 20 mL via INTRAVENOUS

## 2017-07-05 ENCOUNTER — Other Ambulatory Visit: Payer: Self-pay

## 2017-07-05 DIAGNOSIS — K259 Gastric ulcer, unspecified as acute or chronic, without hemorrhage or perforation: Secondary | ICD-10-CM

## 2017-07-13 ENCOUNTER — Telehealth: Payer: Self-pay

## 2017-07-13 NOTE — Telephone Encounter (Signed)
LVM for patient callback for results per Dr. [name].    - liver cysts appear benign   Thickening seen in the stomach. Proceed with EGD as suggested in my last office note and plan

## 2017-08-11 ENCOUNTER — Ambulatory Visit: Payer: BC Managed Care – PPO | Admitting: Gastroenterology

## 2017-08-17 ENCOUNTER — Ambulatory Visit
Admission: RE | Admit: 2017-08-17 | Discharge: 2017-08-17 | Disposition: A | Discharge status: Home / Self Care | Payer: BC Managed Care – PPO | Source: Ambulatory Visit | Attending: Gastroenterology | Admitting: Gastroenterology

## 2017-08-17 ENCOUNTER — Encounter: Payer: Self-pay | Admitting: *Deleted

## 2017-08-17 ENCOUNTER — Ambulatory Visit: Payer: BC Managed Care – PPO | Admitting: Anesthesiology

## 2017-08-17 ENCOUNTER — Encounter
Admission: RE | Disposition: A | Discharge status: Home / Self Care | Payer: Self-pay | Source: Ambulatory Visit | Attending: Gastroenterology

## 2017-08-17 ENCOUNTER — Other Ambulatory Visit: Payer: Self-pay

## 2017-08-17 DIAGNOSIS — Z6838 Body mass index (BMI) 38.0-38.9, adult: Secondary | ICD-10-CM | POA: Insufficient documentation

## 2017-08-17 DIAGNOSIS — Z79899 Other long term (current) drug therapy: Secondary | ICD-10-CM | POA: Diagnosis not present

## 2017-08-17 DIAGNOSIS — K253 Acute gastric ulcer without hemorrhage or perforation: Secondary | ICD-10-CM | POA: Diagnosis not present

## 2017-08-17 DIAGNOSIS — Z8249 Family history of ischemic heart disease and other diseases of the circulatory system: Secondary | ICD-10-CM | POA: Diagnosis not present

## 2017-08-17 DIAGNOSIS — K2950 Unspecified chronic gastritis without bleeding: Secondary | ICD-10-CM | POA: Diagnosis not present

## 2017-08-17 DIAGNOSIS — E669 Obesity, unspecified: Secondary | ICD-10-CM | POA: Insufficient documentation

## 2017-08-17 DIAGNOSIS — K259 Gastric ulcer, unspecified as acute or chronic, without hemorrhage or perforation: Secondary | ICD-10-CM | POA: Diagnosis not present

## 2017-08-17 DIAGNOSIS — Z886 Allergy status to analgesic agent status: Secondary | ICD-10-CM | POA: Diagnosis not present

## 2017-08-17 DIAGNOSIS — K317 Polyp of stomach and duodenum: Secondary | ICD-10-CM | POA: Diagnosis not present

## 2017-08-17 DIAGNOSIS — M1990 Unspecified osteoarthritis, unspecified site: Secondary | ICD-10-CM | POA: Insufficient documentation

## 2017-08-17 HISTORY — PX: ESOPHAGOGASTRODUODENOSCOPY (EGD) WITH PROPOFOL: SHX5813

## 2017-08-17 SURGERY — ESOPHAGOGASTRODUODENOSCOPY (EGD) WITH PROPOFOL
Anesthesia: General

## 2017-08-17 MED ORDER — PROPOFOL 10 MG/ML IV BOLUS
INTRAVENOUS | Status: DC | PRN
  Administered 2017-08-17: 30 mg via INTRAVENOUS
  Administered 2017-08-17 (×2): 50 mg via INTRAVENOUS

## 2017-08-17 MED ORDER — SODIUM CHLORIDE 0.9 % IV SOLN
INTRAVENOUS | Status: DC
  Administered 2017-08-17: 08:00:00 via INTRAVENOUS

## 2017-08-17 MED ORDER — PROPOFOL 500 MG/50ML IV EMUL
INTRAVENOUS | Status: AC
  Filled 2017-08-17: qty 50

## 2017-08-17 MED ORDER — LIDOCAINE HCL (PF) 2 % IJ SOLN
INTRAMUSCULAR | Status: AC
  Filled 2017-08-17: qty 10

## 2017-08-17 MED ORDER — LIDOCAINE HCL (CARDIAC) 20 MG/ML IV SOLN
INTRAVENOUS | Status: DC | PRN
  Administered 2017-08-17: 40 mg via INTRAVENOUS
  Administered 2017-08-17 (×2): 50 mg via INTRAVENOUS

## 2017-08-17 NOTE — H&P (Signed)
     Wyline MoodKiran Lucillie Kiesel, MD [address], [address], [place], [place], [address] [address], [address], [place], [place], [address] Phone: [phone]  Fax: [phone]  Primary Care Physician:  Charlies ConstableSpencer, Donald, MD   Pre-Procedure History & Physical: HPI:  Kathryn Bryan is a 57 y.o. female is here for an endoscopy    History reviewed. No pertinent past medical history.  Past Surgical History:  Procedure Laterality Date  . ABDOMINAL HYSTERECTOMY    . HERNIA REPAIR    . none      Prior to Admission medications   Medication Sig Start Date End Date Taking? Authorizing Provider  pantoprazole (PROTONIX) 40 MG tablet Take 1 tablet (40 mg total) by mouth 2 (two) times daily before a meal. 06/01/17  Yes Enid BaasKalisetti, Radhika, MD  polyethylene glycol (MIRALAX / GLYCOLAX) packet Take 17 g by mouth daily. 06/03/17  Yes Enid BaasKalisetti, Radhika, MD  senna-docusate (SENOKOT-S) 8.6-50 MG tablet Take 1 tablet by mouth at bedtime. 06/01/17  Yes Enid BaasKalisetti, Radhika, MD    Allergies as of 07/05/2017 - Review Complete 06/16/2017  Allergen Reaction Noted  . Ibuprofen  05/30/2017    Family History  Problem Relation Age of Onset  . Heart failure Mother     Social History   Socioeconomic History  . Marital status: Married    Spouse name: Not on file  . Number of children: Not on file  . Years of education: Not on file  . Highest education level: Not on file  Social Needs  . Financial resource strain: Not on file  . Food insecurity - worry: Not on file  . Food insecurity - inability: Not on file  . Transportation needs - medical: Not on file  . Transportation needs - non-medical: Not on file  Occupational History    Employer: [employer]  Tobacco Use  . Smoking status: Never Smoker  . Smokeless tobacco: Never Used  Substance and Sexual Activity  . Alcohol use: No    Frequency: Never  . Drug use: No  . Sexual activity: Not on file  Other Topics Concern  . Not on file  Social History  Narrative  . Not on file    Review of Systems: See HPI, otherwise negative ROS  Physical Exam: BP 137/61   Pulse 72   Temp (!) 96.4 F (35.8 C) (Tympanic)   Resp 15   Ht 5\' 1"  (1.549 m)   Wt 202 lb (91.6 kg)   SpO2 100%   BMI 38.17 kg/m  General:   Alert,  pleasant and cooperative in NAD Head:  Normocephalic and atraumatic. Neck:  Supple; no masses or thyromegaly. Lungs:  Clear throughout to auscultation, normal respiratory effort.    Heart:  +S1, +S2, Regular rate and rhythm, No edema. Abdomen:  Soft, nontender and nondistended. Normal bowel sounds, without guarding, and without rebound.   Neurologic:  Alert and  oriented x4;  grossly normal neurologically.  Impression/Plan: Kathryn Bryan is here for an endoscopy  to be performed for  evaluation of gastric ulcers    Risks, benefits, limitations, and alternatives regarding endoscopy have been reviewed with the patient.  Questions have been answered.  All parties agreeable.   Wyline MoodKiran Jerlisa Diliberto, MD  08/17/2017, 8:07 AM

## 2017-08-17 NOTE — Anesthesia Post-op Follow-up Note (Signed)
Anesthesia QCDR form completed.        

## 2017-08-17 NOTE — Anesthesia Postprocedure Evaluation (Signed)
Anesthesia Post Note  Patient: Kathryn Bryan  Procedure(s) Performed: ESOPHAGOGASTRODUODENOSCOPY (EGD) WITH PROPOFOL (N/A )  Patient location during evaluation: Endoscopy Anesthesia Type: General Level of consciousness: awake and alert and oriented Pain management: pain level controlled Vital Signs Assessment: post-procedure vital signs reviewed and stable Respiratory status: spontaneous breathing, nonlabored ventilation and respiratory function stable Cardiovascular status: blood pressure returned to baseline and stable Postop Assessment: no signs of nausea or vomiting Anesthetic complications: no     Last Vitals:  Vitals:   08/17/17 0927 08/17/17 0932  BP: (!) 136/53 122/66  Pulse:  60  Resp:  14  Temp:    SpO2:  100%    Last Pain:  Vitals:   08/17/17 0910  TempSrc: Tympanic                 Gia Lusher

## 2017-08-17 NOTE — Transfer of Care (Signed)
Immediate Anesthesia Transfer of Care Note  Patient: Kathryn Bryan  Procedure(s) Performed: ESOPHAGOGASTRODUODENOSCOPY (EGD) WITH PROPOFOL (N/A )  Patient Location: PACU and Endoscopy Unit  Anesthesia Type:General  Level of Consciousness: awake  Airway & Oxygen Therapy: Patient Spontanous Breathing  Post-op Assessment: Report given to RN  Post vital signs: stable  Last Vitals:  Vitals:   08/17/17 0742 08/17/17 0910  BP: 137/61   Pulse: 72   Resp: 15 (P) 17  Temp: (!) 35.8 C (P) 36.6 C  SpO2: 100% (P) 100%    Last Pain:  Vitals:   08/17/17 0910  TempSrc: (P) Tympanic         Complications: No apparent anesthesia complications

## 2017-08-17 NOTE — Op Note (Signed)
[facility] Gastroenterology Patient Name: Kathryn Bryan Procedure Date: 08/17/2017 8:54 AM MRN: [id number] Account #: [id number] Date of Birth: 21-May-1961 Admit Type: Outpatient Age: 57 Room: [facility] ENDO ROOM 1 Gender: Female Note Status: Finalized Procedure:            Upper GI endoscopy Indications:          Follow-up of acute gastric ulcer Providers:            Wyline MoodKiran Alyx Gee MD, MD Referring MD:         Dow Adolphonald C. Karleen HampshireSpencer, MD (Referring MD) Medicines:            Monitored Anesthesia Care Complications:        No immediate complications. Procedure:            Pre-Anesthesia Assessment:                       - ASA Grade Assessment: III - A patient with severe                        systemic disease.                       - Prior to the procedure, a History and Physical was                        performed, and patient medications, allergies and                        sensitivities were reviewed. The patient's tolerance of                        previous anesthesia was reviewed.                       - The risks and benefits of the procedure and the                        sedation options and risks were discussed with the                        patient. All questions were answered and informed                        consent was obtained.                       After obtaining informed consent, the endoscope was                        passed under direct vision. Throughout the procedure,                        the patient's blood pressure, pulse, and oxygen                        saturations were monitored continuously. The Endoscope                        was introduced through the mouth, and advanced to the  third part of duodenum. The upper GI endoscopy was                        accomplished with ease. The patient tolerated the                        procedure well. Findings:      The examined duodenum was normal.      The esophagus was  normal.      Localized moderately erythematous mucosa without bleeding was found at       the incisura. Biopsies were taken with a cold forceps for histology.      The exam was otherwise without abnormality.      The cardia and gastric fundus were normal on retroflexion. Impression:           - Normal examined duodenum.                       - Normal esophagus.                       - Erythematous mucosa in the incisura. Biopsied.                       - The examination was otherwise normal. Recommendation:       - Discharge patient to home (with escort).                       - Resume previous diet.                       - Continue present medications.                       - Await pathology results. Procedure Code(s):    --- Professional ---                       [phone], Esophagogastroduodenoscopy, flexible, transoral;                        with biopsy, single or multiple Diagnosis Code(s):    --- Professional ---                       K31.89, Other diseases of stomach and duodenum                       K25.3, Acute gastric ulcer without hemorrhage or                        perforation CPT copyright 2016 American Medical Association. All rights reserved. The codes documented in this report are preliminary and upon coder review may  be revised to meet current compliance requirements. [name], MD [name] MD, MD 08/17/2017 9:07:27 AM This report has been signed electronically. Number of Addenda: 0 Note Initiated On: 08/17/2017 8:54 AM      [facility]

## 2017-08-17 NOTE — Anesthesia Preprocedure Evaluation (Signed)
Anesthesia Evaluation  Patient identified by MRN, date of birth, ID band Patient awake    Reviewed: Allergy & Precautions, NPO status , Patient's Chart, lab work & pertinent test results  History of Anesthesia Complications Negative for: history of anesthetic complications  Airway Mallampati: III  TM Distance: >3 FB Neck ROM: Full  Mouth opening: Limited Mouth Opening  Dental  (+) Poor Dentition, Chipped   Pulmonary neg pulmonary ROS, neg sleep apnea, neg COPD,    breath sounds clear to auscultation- rhonchi (-) wheezing      Cardiovascular Exercise Tolerance: Good (-) hypertension(-) CAD, (-) Past MI, (-) Cardiac Stents and (-) CABG  Rhythm:Regular Rate:Normal - Systolic murmurs and - Diastolic murmurs    Neuro/Psych negative neurological ROS  negative psych ROS   GI/Hepatic negative GI ROS, Neg liver ROS,   Endo/Other  negative endocrine ROSneg diabetes  Renal/GU negative Renal ROS     Musculoskeletal  (+) Arthritis ,   Abdominal (+) + obese,   Peds  Hematology negative hematology ROS (+)   Anesthesia Other Findings    Reproductive/Obstetrics                             Anesthesia Physical Anesthesia Plan  ASA: II  Anesthesia Plan: General   Post-op Pain Management:    Induction: Intravenous  PONV Risk Score and Plan: 2 and Propofol infusion  Airway Management Planned: Natural Airway  Additional Equipment:   Intra-op Plan:   Post-operative Plan:   Informed Consent: I have reviewed the patients History and Physical, chart, labs and discussed the procedure including the risks, benefits and alternatives for the proposed anesthesia with the patient or authorized representative who has indicated his/her understanding and acceptance.   Dental advisory given  Plan Discussed with: CRNA and Anesthesiologist  Anesthesia Plan Comments:         Anesthesia Quick  Evaluation

## 2017-08-18 ENCOUNTER — Encounter: Payer: Self-pay | Admitting: Gastroenterology

## 2017-08-20 LAB — SURGICAL PATHOLOGY

## 2017-08-23 ENCOUNTER — Telehealth: Payer: Self-pay

## 2017-08-23 NOTE — Telephone Encounter (Signed)
LVM for patient callback for results per Dr. [name].    - Inform biopsies show "healing ulcer"

## 2017-08-25 ENCOUNTER — Telehealth: Payer: Self-pay

## 2017-08-25 NOTE — Telephone Encounter (Signed)
LVM for patient callback for results per Dr. [name] x3.   - "healing ulcer"

## 2017-08-26 ENCOUNTER — Telehealth: Payer: Self-pay | Admitting: Gastroenterology

## 2017-08-26 NOTE — Telephone Encounter (Signed)
Pt missed a call fro Biopy results please call   # [phone] ask for Rileigh  Caryn SectionFox

## 2017-08-26 NOTE — Telephone Encounter (Signed)
Patient has been informed her path results indicated her ulcer is healing and to continue with her PPI. Thx Marcelino DusterMichelle

## 2017-09-05 ENCOUNTER — Other Ambulatory Visit: Payer: Self-pay

## 2017-09-06 ENCOUNTER — Ambulatory Visit (INDEPENDENT_AMBULATORY_CARE_PROVIDER_SITE_OTHER): Payer: BC Managed Care – PPO | Admitting: Gastroenterology

## 2017-09-06 ENCOUNTER — Encounter (INDEPENDENT_AMBULATORY_CARE_PROVIDER_SITE_OTHER): Payer: Self-pay

## 2017-09-06 ENCOUNTER — Encounter: Payer: Self-pay | Admitting: Gastroenterology

## 2017-09-06 VITALS — BP 134/78 | HR 88 | Temp 98.1°F | Ht 61.0 in | Wt 199.6 lb

## 2017-09-06 DIAGNOSIS — K30 Functional dyspepsia: Secondary | ICD-10-CM

## 2017-09-06 MED ORDER — DESIPRAMINE HCL 25 MG PO TABS: 25.0000 mg | ORAL_TABLET | Freq: Every day | ORAL | 0 refills | Status: AC

## 2017-09-06 NOTE — Progress Notes (Signed)
Provided samples of FDgard #16 tabs (4 boxes) to try before purchasing. Lot# [id number] exp. 08/2019.

## 2017-09-06 NOTE — Progress Notes (Signed)
[name] MD, MRCP(U.K) [address]  [address]  [place], [place] [address]  Main: [phone]  Fax: [phone]   Primary Care Physician: Charlies Constable, MD  Primary Gastroenterologist:  Dr.Reilly Blades   Chief Complaint  Patient presents with  . follow up gastric ulcers    HPI: Kathryn Bryan is a 57 y.o. female   Summary of history :  She is here today for hospital follow-up.  She was admitted to the hospital on 05/30/2017 for epigastric pain, intractable nausea vomiting.  She was seen by my colleague she was admitted with a 3-day prior history of nausea and emesis.  She underwent a CT scan of the abdomen and pelvis with contrast which demonstrated a circumferential wall thickening of the antrum of the stomach and pylorus.  There are also 2 tiny hypodensities within the liver measuring up to 8 mm in size.  She underwent an upper endoscopy on 05/31/2017 with Dr Allegra Lai which demonstrated a large ulcer in the antrum of the stomach which was not bleeding.  Biopsies were taken which demonstrated moderate chronic active gastritis and ulceration and focally increased eosinophils.  Random gastric biopsies did not demonstrate eosinophils was minimal chronic gastritis negative for H. pylori.  She had a CBC on 06/01/2017 which demonstrated a hemoglobin of 12.9 g.  Interval history 05/31/2017 to 06/16/2017 Explained the results of the biopsies.  At the time of discharge was started on a PPI.Denies any prior NSAID use.  Presently she says has no nausea, still has some discomfort, gradually getting better Taking Protonix 40 mg twice daily . Not a smoker.   Follow-up visit 09/06/2017 She underwent repeat EGD to confirm healing of gastric ulcer. Repeat gastric biopsies did not reveal evidence of H. Pylori. She is currently on Protonix 40 mg twice daily. She continues to have mild epigastric discomfort which is occasional. She reports that she is generally very stressful at work and thinks her  symptoms are most likely secondary to stress. She lost about 14pounds since she was discharged from the hospital with dietary modification. She no longer consumes fried foods, red meat. She had H. Pylori stool antigen which was negative  NSAIDs: None, denies using BC powder, goody powder   Antiplts/Anticoagulants/Anti thrombotics:  None  She denies family history of GI malignancy except father with colon cancer in 58s  GI Procedures:  Colonoscopy at [facility] 02/01/2014 Impression:- The entire examined colon is normal.  - The distal rectum and anal verge are normal on   retroflexion view.  - No specimens collected.  EGD 05/31/2017: The duodenal bulb and second portion of the duodenum were normal. 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 The lesion was 30 mm in largest dimension. There is no evidence of perforation. Biopsies were taken with a cold forceps for histology to rule out malignancy. The cardia, gastric fundus, gastric body and incisura were normal. Biopsies were taken with a cold forceps for Helicobacter pylori testing DIAGNOSIS:  A. STOMACH ULCER, ANTRUM; COLD BIOPSY:  - ANTRAL MUCOSA WITH MODERATE CHRONIC ACTIVE GASTRITIS AND ULCERATION  WITH REACTIVE FOVEOLAR HYPERPLASIA AND FOCAL INCREASED EOSINOPHILS.  - NEGATIVE FOR DYSPLASIA AND MALIGNANCY.   B. STOMACH; COLD BIOPSY:  - OXYNTIC MUCOSA WITH MINIMAL CHRONIC GASTRITIS.  - NEGATIVE FOR H. PYLORI, DYSPLASIA, AND MALIGNANCY.  EGD 08/17/2017 - Normal examined duodenum. - Normal esophagus. - Erythematous mucosa in the incisura. Biopsied. - The examination was otherwise normal. DIAGNOSIS:  A. STOMACH,  ANTRUM AND BODY JUNCTION; COLD BIOPSY:  - ANTRAL-TYPE MUCOSA WITH STROMAL EDEMA AND CHRONIC INFLAMMATION,  CONSISTENT WITH HEALED INJURY, AND STRIPS OF SURFACE EPITHELIUM.  - NEGATIVE FOR H. PYLORI, INTESTINAL METAPLASIA, DYSPLASIA, AND  MALIGNANCY.  Current Outpatient Medications  Medication Sig Dispense Refill  . pantoprazole (PROTONIX) 40 MG tablet Take 1 tablet (40 mg total) by mouth 2 (two) times daily before a meal. 60 tablet 3  . desipramine (NORPRAMIN) 25 MG tablet Take 1 tablet (25 mg total) by mouth at bedtime. 90 tablet 0  . HYDROcodone-acetaminophen (NORCO/VICODIN) 5-325 MG tablet     . polyethylene glycol (MIRALAX / GLYCOLAX) packet Take 17 g by mouth daily. (Patient not taking: Reported on 09/06/2017) 14 each 0  . senna-docusate (SENOKOT-S) 8.6-50 MG tablet Take 1 tablet by mouth at bedtime. (Patient not taking: Reported on 09/06/2017) 30 tablet 0   No current facility-administered medications for this visit.     Allergies as of 09/06/2017 - Review Complete 09/06/2017  Allergen Reaction Noted  . Ibuprofen  05/30/2017    ROS:  General: Negative for anorexia, weight loss, fever, chills, fatigue, weakness. ENT: Negative for hoarseness, difficulty swallowing , nasal congestion. CV: Negative for chest pain, angina, palpitations, dyspnea on exertion, peripheral edema.  Respiratory: Negative for dyspnea at rest, dyspnea on exertion, cough, sputum, wheezing.  GI: See history of present illness. GU:  Negative for dysuria, hematuria, urinary incontinence, urinary frequency, nocturnal urination.  Endo: Negative for unusual weight change.    Physical Examination:   BP 134/78   Pulse 88   Temp 98.1 F (36.7 C) (Oral)   Ht 5\' 1"  (1.549 m)   Wt 90.5 kg (199 lb 9.6 oz)   BMI 37.71 kg/m   General: Well-nourished, well-developed in no acute distress.  Eyes: No icterus. Conjunctivae pink. Mouth: Oropharyngeal mucosa moist and pink , no lesions erythema or exudate. Lungs: Clear  to auscultation bilaterally. Non-labored. Heart: Regular rate and rhythm, no murmurs rubs or gallops.  Abdomen: Bowel sounds are normal, nontender, nondistended, no hepatosplenomegaly or masses, no abdominal bruits or hernia , no rebound or guarding.   Extremities: No lower extremity edema. No clubbing or deformities. Neuro: Alert and oriented x 3.  Grossly intact. Skin: Warm and dry, no jaundice.   Psych: Alert and cooperative, normal mood and affect.   Imaging Studies: No results found.  Assessment and Plan:   Glendi Vic BlackbirdC Stricker is a 57 y.o. African-American female here to follow up of peptic ulcer disease. Repeat EGD confirmed healing of gastric ulcer. There is no evidence of H. Pylori based on gastric biopsies and stool antigen testing. She continues to have mild epigastric pain  Nonulcer dyspepsia: probably in the setting of stress and recent peptic ulcer disease Continue Protonix 40 mg daily Start desipramine 25 mg at bedtime Trial of FD guard  abnormal lesions of liver on CT: MRI liver mass protocol revealed small liver cysts. No suspicious enhancing liver lesions identified  Follow-up in 2 months  Arlyss Repressohini R Angelyn Osterberg, MD [address]  [address]  [place], [place] [address]  Main: [phone]  Fax: [phone] Pager: [phone]

## 2017-11-10 ENCOUNTER — Encounter: Payer: Self-pay | Admitting: Gastroenterology

## 2017-11-10 ENCOUNTER — Ambulatory Visit: Payer: BC Managed Care – PPO | Admitting: Gastroenterology

## 2017-11-10 VITALS — BP 107/72 | HR 80 | Resp 17 | Ht 61.0 in | Wt 195.0 lb

## 2017-11-10 DIAGNOSIS — Z8711 Personal history of peptic ulcer disease: Secondary | ICD-10-CM

## 2017-11-10 DIAGNOSIS — K5904 Chronic idiopathic constipation: Secondary | ICD-10-CM

## 2017-11-10 DIAGNOSIS — Z8719 Personal history of other diseases of the digestive system: Secondary | ICD-10-CM | POA: Diagnosis not present

## 2017-11-10 NOTE — Progress Notes (Signed)
[name] MD, MRCP(U.K) [address]  [address]  [place], [place] [address]  Main: [phone]  Fax: [phone]   Primary Care Physician: Charlies Constable, MD  Primary Gastroenterologist:  Dr.Vanga   Chief Complaint  Patient presents with  . Follow-up    2 mo f/u functional dyspepsia  . Abdominal Pain    mid upper    HPI: Kathryn Bryan is a 57 y.o. female   Summary of history :  She is here today for hospital follow-up.  She was admitted to the hospital on 05/30/2017 for epigastric pain, intractable nausea vomiting.  She was seen by my colleague she was admitted with a 3-day prior history of nausea and emesis.  She underwent a CT scan of the abdomen and pelvis with contrast which demonstrated a circumferential wall thickening of the antrum of the stomach and pylorus.  There are also 2 tiny hypodensities within the liver measuring up to 8 mm in size.  She underwent an upper endoscopy on 05/31/2017 with Dr Allegra Lai which demonstrated a large ulcer in the antrum of the stomach which was not bleeding.  Biopsies were taken which demonstrated moderate chronic active gastritis and ulceration and focally increased eosinophils.  Random gastric biopsies did not demonstrate eosinophils was minimal chronic gastritis negative for H. pylori.  She had a CBC on 06/01/2017 which demonstrated a hemoglobin of 12.9 g.  Interval history 05/31/2017 to 06/16/2017 Explained the results of the biopsies.  At the time of discharge was started on a PPI.Denies any prior NSAID use.  Presently she says has no nausea, still has some discomfort, gradually getting better Taking Protonix 40 mg twice daily . Not a smoker.   Follow-up visit 09/06/2017 She underwent repeat EGD to confirm healing of gastric ulcer. Repeat gastric biopsies did not reveal evidence of H. Pylori. She is currently on Protonix 40 mg twice daily. She continues to have mild epigastric discomfort which is occasional. She reports that she  is generally very stressful at work and thinks her symptoms are most likely secondary to stress. She lost about 14pounds since she was discharged from the hospital with dietary modification. She no longer consumes fried foods, red meat. She had H. Pylori stool antigen which was negative  Follow-up visit 11/10/2017 She continues to have episodes of mild upper abdominal pain and she thinks it's mostly due to stress and worried about the recurrence of ulcer. She is not taking Protonix as is suggested. She is taking desipramine only as needed for stress. She has avoided red meat, carbonated beverages and sweet tea. Her bowels were regular when she was taking MiraLAX daily. Currently, she has infrequent bowel movements and she stopped MiraLAX. She has 1 hard bowel movement approximately once a week.  NSAIDs: None, denies using BC powder, goody powder   Antiplts/Anticoagulants/Anti thrombotics:  None  She denies family history of GI malignancy except father with colon cancer in 63s  GI Procedures:  Colonoscopy at [facility] 02/01/2014 Impression:- The entire examined colon is normal.  - The distal rectum and anal verge are normal on   retroflexion view.  - No specimens collected.  EGD 05/31/2017: The duodenal bulb and second portion of the duodenum were normal. 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 The lesion was 30 mm in  largest dimension. There is no evidence of perforation. Biopsies were taken with a cold forceps for histology to rule out malignancy. The cardia, gastric fundus, gastric body and incisura were normal. Biopsies were taken with a cold forceps for Helicobacter pylori  testing DIAGNOSIS:  A. STOMACH ULCER, ANTRUM; COLD BIOPSY:  - ANTRAL MUCOSA WITH MODERATE CHRONIC ACTIVE GASTRITIS AND ULCERATION  WITH REACTIVE FOVEOLAR HYPERPLASIA AND FOCAL INCREASED EOSINOPHILS.  - NEGATIVE FOR DYSPLASIA AND MALIGNANCY.   B. STOMACH; COLD BIOPSY:  - OXYNTIC MUCOSA WITH MINIMAL CHRONIC GASTRITIS.  - NEGATIVE FOR H. PYLORI, DYSPLASIA, AND MALIGNANCY.   EGD 08/17/2017 - Normal examined duodenum. - Normal esophagus. - Erythematous mucosa in the incisura. Biopsied. - The examination was otherwise normal. DIAGNOSIS:  A. STOMACH, ANTRUM AND BODY JUNCTION; COLD BIOPSY:  - ANTRAL-TYPE MUCOSA WITH STROMAL EDEMA AND CHRONIC INFLAMMATION,  CONSISTENT WITH HEALED INJURY, AND STRIPS OF SURFACE EPITHELIUM.  - NEGATIVE FOR H. PYLORI, INTESTINAL METAPLASIA, DYSPLASIA, AND  MALIGNANCY.  Current Outpatient Medications  Medication Sig Dispense Refill  . desipramine (NORPRAMIN) 25 MG tablet Take 1 tablet (25 mg total) by mouth at bedtime. 90 tablet 0  . traZODone (DESYREL) 50 MG tablet Take by mouth.    . pantoprazole (PROTONIX) 40 MG tablet Take 1 tablet (40 mg total) by mouth 2 (two) times daily before a meal. (Patient not taking: Reported on 11/10/2017) 60 tablet 3   No current facility-administered medications for this visit.     Allergies as of 11/10/2017 - Review Complete 11/10/2017  Allergen Reaction Noted  . Ibuprofen  05/30/2017    ROS:  General: Negative for anorexia, weight loss, fever, chills, fatigue, weakness. ENT: Negative for hoarseness, difficulty swallowing , nasal congestion. CV: Negative for chest pain, angina, palpitations, dyspnea on exertion, peripheral edema.  Respiratory: Negative for dyspnea at rest, dyspnea on exertion, cough, sputum, wheezing.  GI: See history of present illness. GU:  Negative for dysuria, hematuria, urinary incontinence, urinary frequency, nocturnal urination.  Endo: Negative for unusual weight change.    Physical  Examination:   BP 107/72 (BP Location: Left Arm, Patient Position: Sitting, Cuff Size: Large)   Pulse 80   Resp 17   Ht  (1.549 m)   Wt 195 lb (88.5 kg)   BMI 36.84 kg/m   General: Well-nourished, well-developed in no acute distress.  Eyes: No icterus. Conjunctivae pink. Mouth: Oropharyngeal mucosa moist and pink , no lesions erythema or exudate. Lungs: Clear to auscultation bilaterally. Non-labored. Heart: Regular rate and rhythm, no murmurs rubs or gallops.  Abdomen: Bowel sounds are normal, nontender, nondistended, no hepatosplenomegaly or masses, no abdominal bruits or hernia , no rebound or guarding.   Extremities: No lower extremity edema. No clubbing or deformities. Neuro: Alert and oriented x 3.  Grossly intact. Skin: Warm and dry, no jaundice.   Psych: Alert and cooperative, normal mood and affect.   Imaging Studies: Abdominal imaging reviewed  Assessment and Plan:   Chaunda JONESHA TSUCHIYA is a 57 y.o. African-American female here to follow up of peptic ulcer disease. Repeat EGD confirmed healing of gastric ulcer. There is no evidence of H. Pylori based on gastric biopsies and stool antigen testing. She continues to have mild epigastric pain most likely stress related and worsened with poorly controlled constipation  Nonulcer dyspepsia: probably in the setting of stress and recent peptic ulcer disease restart Protonix 40 mg daily take desipramine 25 mg at bedtime daily If she develops worsening of pain, will repeat EGD to evaluate for recurrence  of ulcer  Chronic constipation: Trial of linaclotide 145 MCG daily, samples provided  Follow-up in 3 months  Arlyss Repress, MD [address]  [address]  [place], [place] [address]  Main: [phone]  Fax: [phone] Pager: [phone]

## 2017-11-10 NOTE — Patient Instructions (Signed)
High-Fiber Diet  Fiber, also called dietary fiber, is a type of carbohydrate found in fruits, vegetables, whole grains, and beans. A high-fiber diet can have many health benefits. Your health care provider may recommend a high-fiber diet to help:  · Prevent constipation. Fiber can make your bowel movements more regular.  · Lower your cholesterol.  · Relieve hemorrhoids, uncomplicated diverticulosis, or irritable bowel syndrome.  · Prevent overeating as part of a weight-loss plan.  · Prevent heart disease, type 2 diabetes, and certain cancers.    What is my plan?  The recommended daily intake of fiber includes:  · 38 grams for men under age 50.  · 30 grams for men over age 50.  · 25 grams for women under age 50.  · 21 grams for women over age 50.    You can get the recommended daily intake of dietary fiber by eating a variety of fruits, vegetables, grains, and beans. Your health care provider may also recommend a fiber supplement if it is not possible to get enough fiber through your diet.  What do I need to know about a high-fiber diet?  · Fiber supplements have not been widely studied for their effectiveness, so it is better to get fiber through food sources.  · Always check the fiber content on the nutrition facts label of any prepackaged food. Look for foods that contain at least 5 grams of fiber per serving.  · Ask your dietitian if you have questions about specific foods that are related to your condition, especially if those foods are not listed in the following section.  · Increase your daily fiber consumption gradually. Increasing your intake of dietary fiber too quickly may cause bloating, cramping, or gas.  · Drink plenty of water. Water helps you to digest fiber.  What foods can I eat?  Grains  Whole-grain breads. Multigrain cereal. Oats and oatmeal. Brown rice. Barley. Bulgur wheat. Millet. Bran muffins. Popcorn. Rye wafer crackers.  Vegetables   Sweet potatoes. Spinach. Kale. Artichokes. Cabbage. Broccoli. Green peas. Carrots. Squash.  Fruits  Berries. Pears. Apples. Oranges. Avocados. Prunes and raisins. Dried figs.  Meats and Other Protein Sources  Navy, kidney, pinto, and soy beans. Split peas. Lentils. Nuts and seeds.  Dairy  Fiber-fortified yogurt.  Beverages  Fiber-fortified soy milk. Fiber-fortified orange juice.  Other  Fiber bars.  The items listed above may not be a complete list of recommended foods or beverages. Contact your dietitian for more options.  What foods are not recommended?  Grains  White bread. Pasta made with refined flour. White rice.  Vegetables  Fried potatoes. Canned vegetables. Well-cooked vegetables.  Fruits  Fruit juice. Cooked, strained fruit.  Meats and Other Protein Sources  Fatty cuts of meat. Fried poultry or fried fish.  Dairy  Milk. Yogurt. Cream cheese. Sour cream.  Beverages  Soft drinks.  Other  Cakes and pastries. Butter and oils.  The items listed above may not be a complete list of foods and beverages to avoid. Contact your dietitian for more information.  What are some tips for including high-fiber foods in my diet?  · Eat a wide variety of high-fiber foods.  · Make sure that half of all grains consumed each day are whole grains.  · Replace breads and cereals made from refined flour or white flour with whole-grain breads and cereals.  · Replace white rice with brown rice, bulgur wheat, or millet.  · Start the day with a breakfast that is high in fiber,   such as a cereal that contains at least 5 grams of fiber per serving.  · Use beans in place of meat in soups, salads, or pasta.  · Eat high-fiber snacks, such as berries, raw vegetables, nuts, or popcorn.  This information is not intended to replace advice given to you by your health care provider. Make sure you discuss any questions you have with your health care provider.  Document Released: 06/08/2005 Document Revised: 11/14/2015 Document Reviewed: 11/21/2013   Elsevier Interactive Patient Education © 2018 [employer]

## 2018-01-14 ENCOUNTER — Telehealth: Payer: Self-pay | Admitting: Gastroenterology

## 2018-01-14 NOTE — Telephone Encounter (Signed)
Patient will call after her vacation to reschedule.

## 2018-01-14 NOTE — Telephone Encounter (Signed)
Patient will call back to reschedule her 8/19 appt with Dr. Allegra LaiVanga

## 2018-02-07 ENCOUNTER — Ambulatory Visit: Payer: BC Managed Care – PPO | Admitting: Gastroenterology

## 2019-02-26 IMAGING — CR DG ABDOMEN 1V
2 series · 2 of 2 positions shown · non-contrast
Comparison: CT abdomen pelvis 05/30/2017

CLINICAL DATA: Abdominal pain.  No recent bowel movement.

EXAM:
ABDOMEN - 1 VIEW

[abdomen kub (1 of 2)]
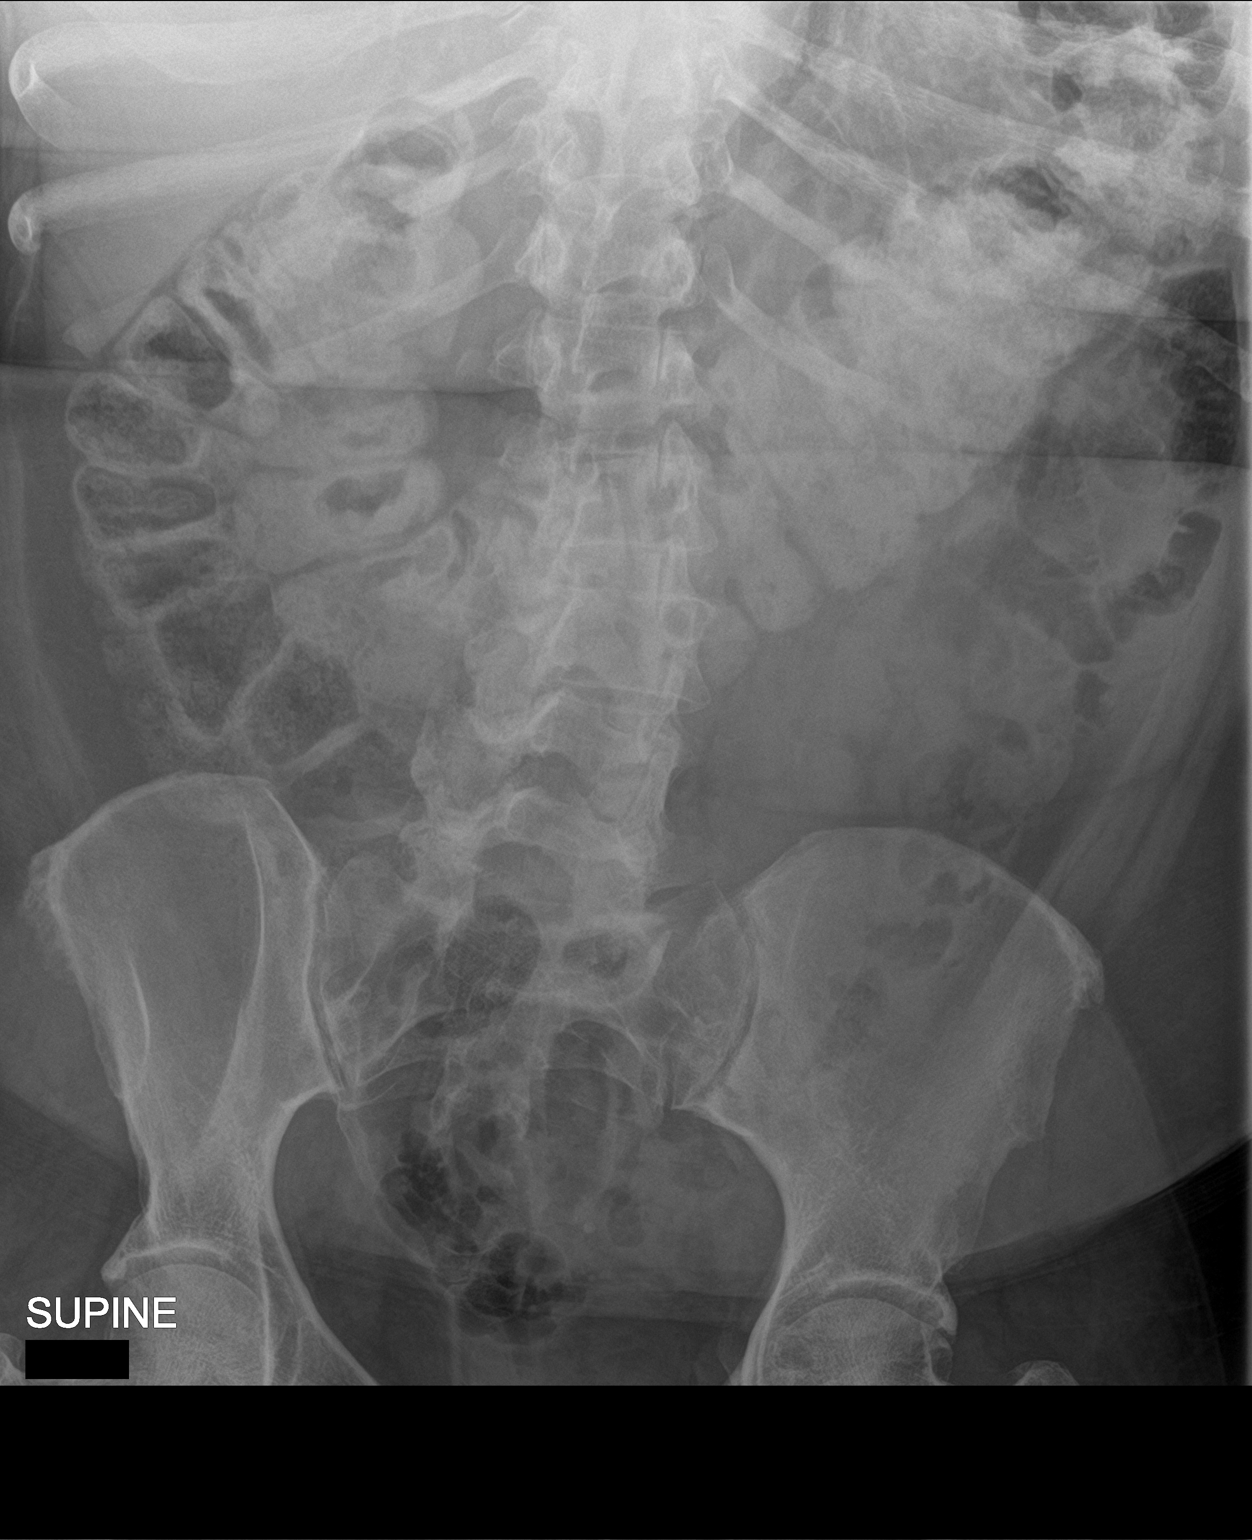

[abdomen kub (2 of 2)]
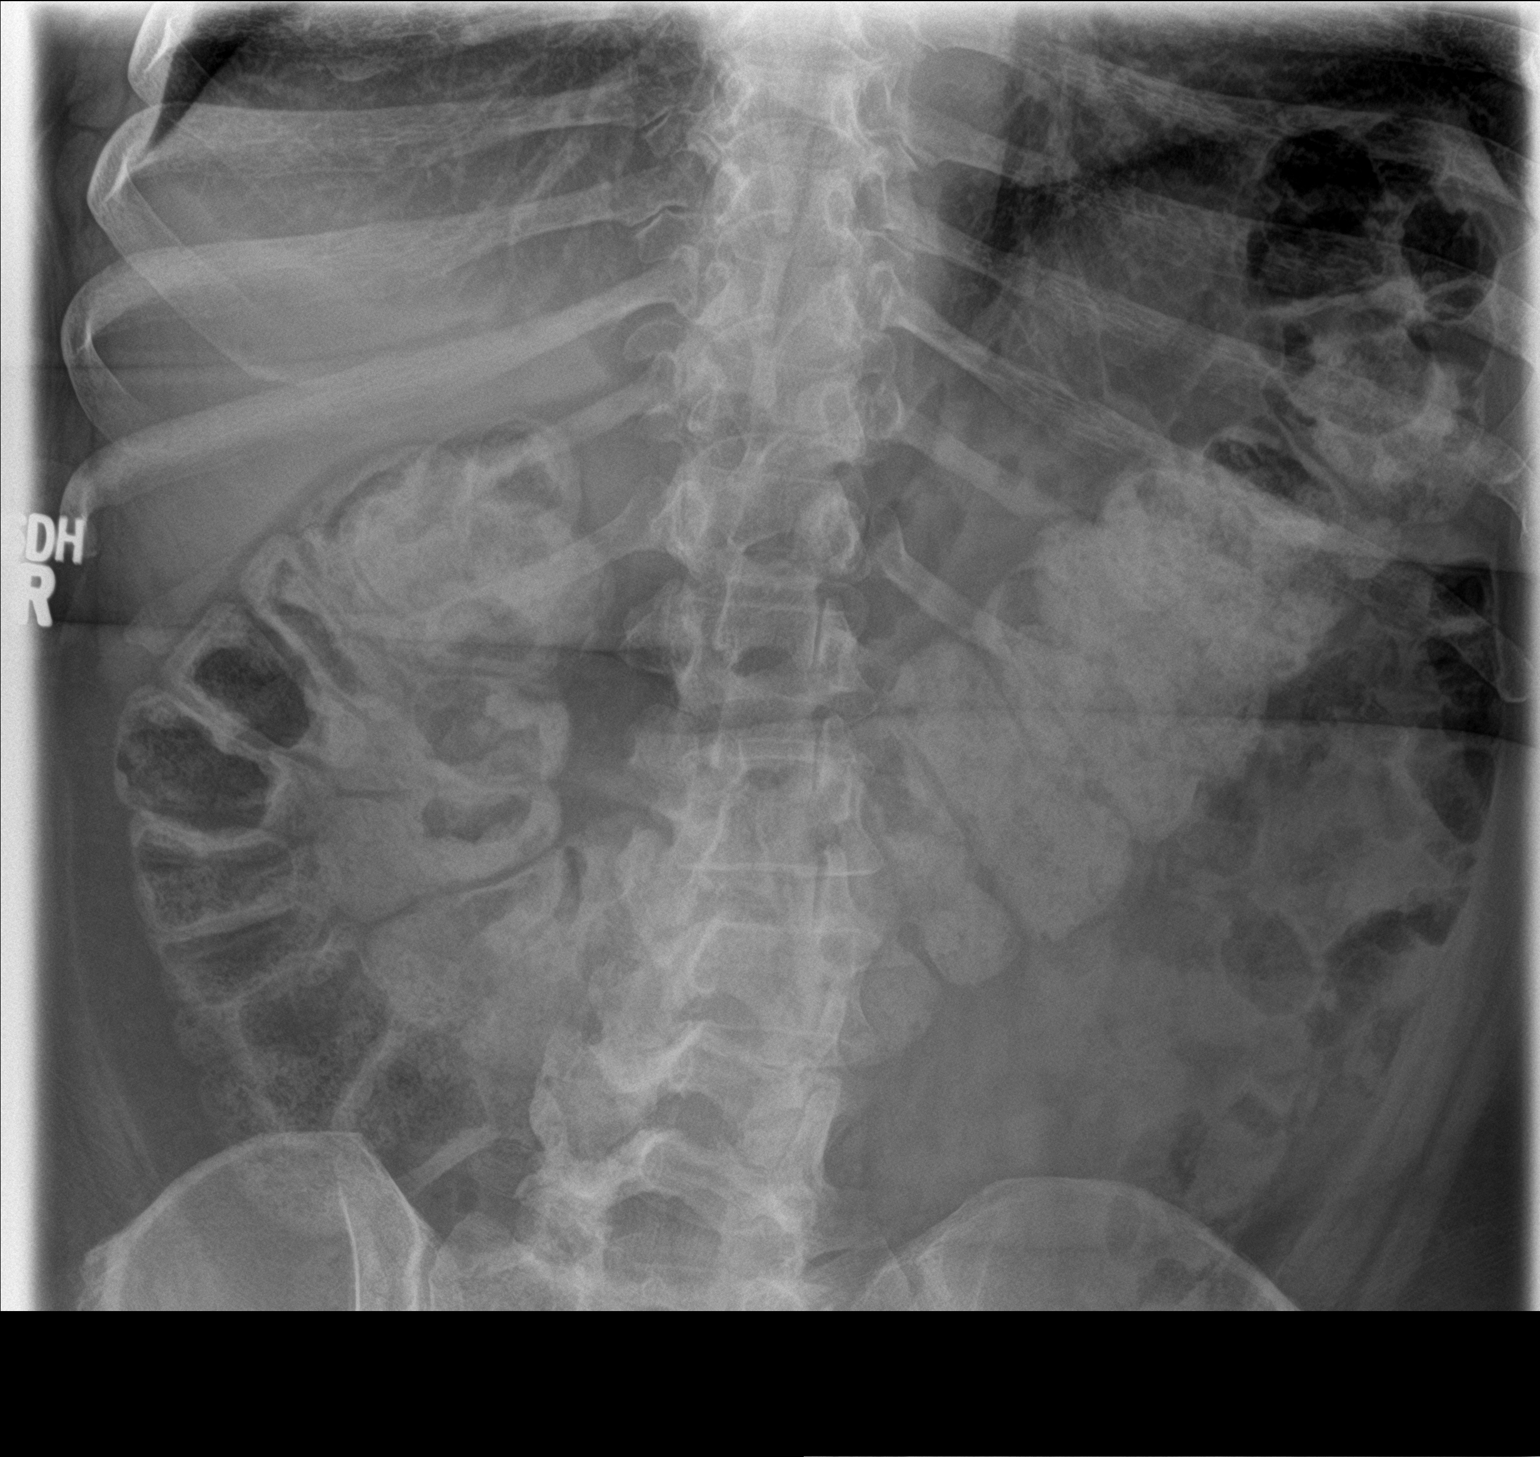

[2 of 2 positions shown; findings below may reference images not displayed]

FINDINGS: There is a large amount of stool in the colon, including mildly
hyperdense transverse colonic stool. No dilated small bowel.
IMPRESSION: Large amount of stool in the colon.

## 2019-03-29 IMAGING — MR MR ABDOMEN WO/W CM
7 of 17 series · 18 of 48 positions shown · IV contrast (20 ML MULTIHANCE)
Comparison: CT abdomen and pelvis 05/30/2017.

CLINICAL DATA: Liver disease. Follow-up nonspecific hypodensities
identified on recent CT scan. Increased risk for HCC.

EXAM:
MRI ABDOMEN WITHOUT AND WITH CONTRAST
TECHNIQUE: Multiplanar multisequence MR imaging of the abdomen was performed
both before and after the administration of intravenous contrast.
CONTRAST:  20 cc of MultiHance

[Series 3: T2 · coronal · 8.0mm · 1.56mm/px · 2 of 25 slices shown (1 of 2)]
[im 1/25]
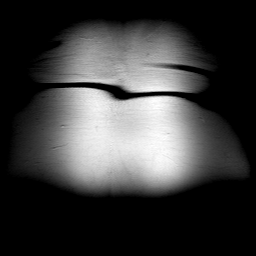
[im 25/25]
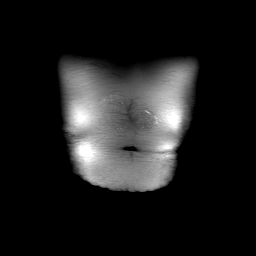

[Series 4: T2 fat-sat · axial · 7.0mm · 0.74mm/px · z∈[-6,+196]mm · 2 of 25 slices shown]
[im 1/25]
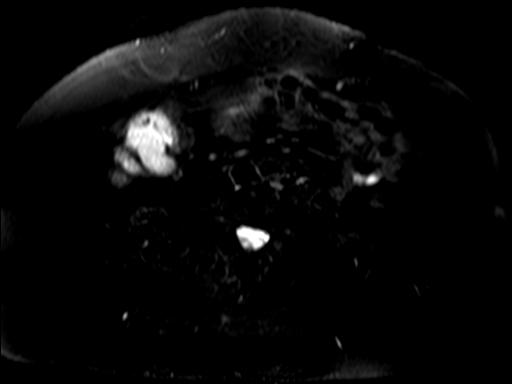
[im 25/25]
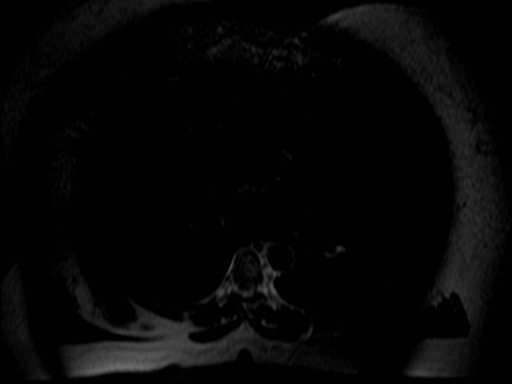

[Series 5: axial in-out of · axial · 7.0mm · 0.74mm/px · z∈[-6,+196]mm · 3 of 50 slices shown]
[im 1/50]
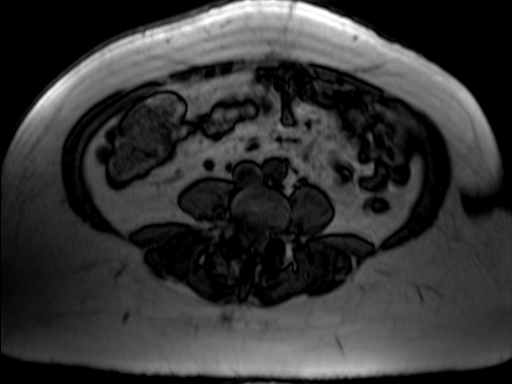
[im 25/50]
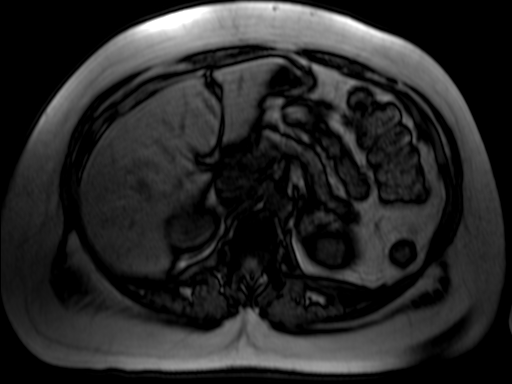
[im 50/50]
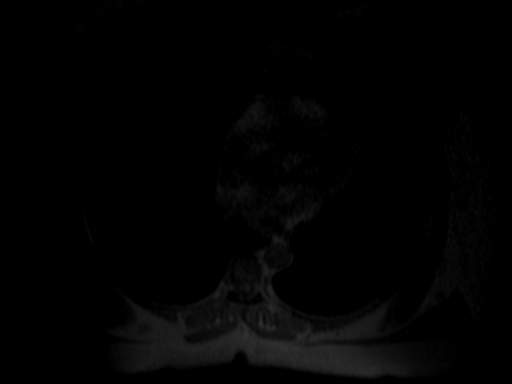

[Series 6: T2 · axial · 8.0mm · 1.48mm/px · 1 of 23 slices shown (2 of 2)]
[im 1/23]
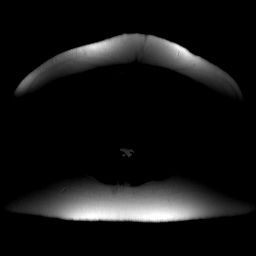

[Series 7: axial true fisp · axial · 4.5mm · 0.74mm/px · z∈[+1,+189]mm · 3 of 43 slices shown]
[im 1/43]
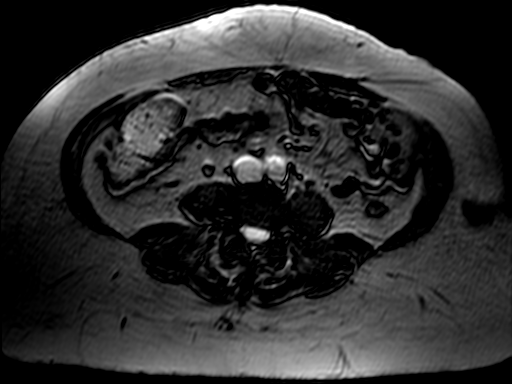
[im 22/43]
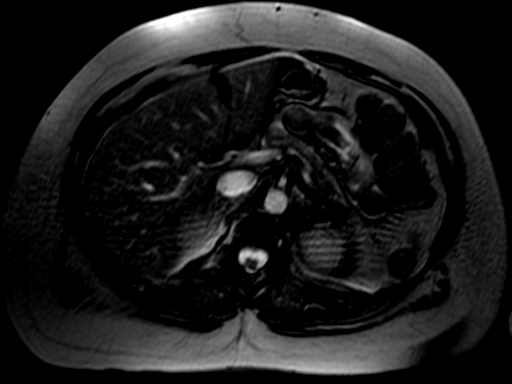
[im 43/43]
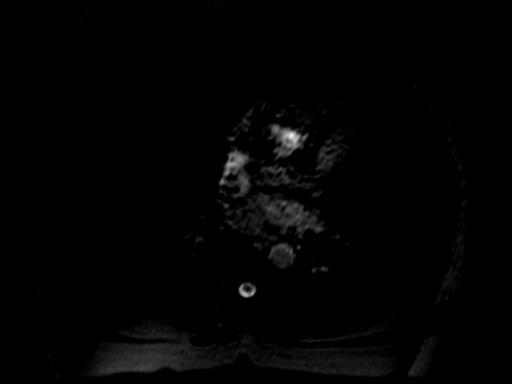

[Series 8: DWI · axial · 6.0mm · 2.97mm/px · z∈[-9,+199]mm · 5 of 90 slices shown]
[im 1/90]
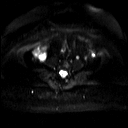
[im 23/90]
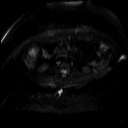
[im 45/90]
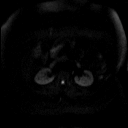
[im 67/90]
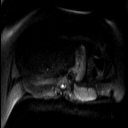
[im 90/90]
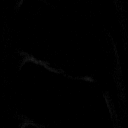

[Series 9: axial dwi_adc · axial · 6.0mm · 2.97mm/px · z∈[-9,+199]mm · 2 of 29 slices shown]
[im 1/29]
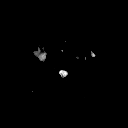
[im 29/29]
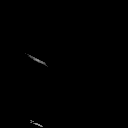

[18 of 48 positions shown; findings below may reference images not displayed]

FINDINGS: Exam detail diminished due to motion artifact.

Lower chest: No acute findings.

Hepatobiliary: There is no abnormal enhancement associated with the
8 mm T2 hyperintense and T1 hypointense lesion within the far
lateral segment of left lobe of liver corresponding to hypodense
lesion referenced on recent CT. The additional milli metric
hypodense lesions noted within the right lobe of liver are T2
hyperintense and do not exhibit any abnormal enhancement. No
suspicious enhancing liver lesions identified. The gallbladder
appears normal. No biliary dilatation.

Pancreas: No mass, inflammatory changes, or other parenchymal
abnormality identified.

Spleen:  Within normal limits in size and appearance.

Adrenals/Urinary Tract:  The adrenal glands appear normal.

No kidney mass or hydronephrosis identified.

Stomach/Bowel: Similar appearance of circumferential wall thickening
involving the gastric antrum, image number 32 of series 15.
Nonspecific. No abnormal dilatation of the upper abdominal bowel
loops.

Vascular/Lymphatic: Normal appearance of the abdominal aorta. No
aneurysm. No adenopathy.

Other: Supraumbilical ventral abdominal wall hernia is again
identified which contains fat only, image 45 of series 13.

Musculoskeletal: No suspicious bone lesions identified.
IMPRESSION: 1. Small liver cysts. No suspicious enhancing liver lesions
identified.
2. Again noted is circumferential wall thickening at the gastric
antrum of the stomach. This is an indeterminate finding. Correlate
with patient's symptoms. Endoscopy is recommended for further
evaluation if clinically appropriate.

## 2020-12-31 ENCOUNTER — Encounter: Payer: Self-pay | Admitting: Emergency Medicine

## 2020-12-31 DIAGNOSIS — Z79899 Other long term (current) drug therapy: Secondary | ICD-10-CM

## 2020-12-31 DIAGNOSIS — D72829 Elevated white blood cell count, unspecified: Secondary | ICD-10-CM | POA: Diagnosis present

## 2020-12-31 DIAGNOSIS — R1013 Epigastric pain: Secondary | ICD-10-CM | POA: Diagnosis present

## 2020-12-31 DIAGNOSIS — Z20822 Contact with and (suspected) exposure to covid-19: Secondary | ICD-10-CM | POA: Diagnosis present

## 2020-12-31 DIAGNOSIS — Z800 Family history of malignant neoplasm of digestive organs: Secondary | ICD-10-CM

## 2020-12-31 DIAGNOSIS — Z833 Family history of diabetes mellitus: Secondary | ICD-10-CM

## 2020-12-31 DIAGNOSIS — E669 Obesity, unspecified: Secondary | ICD-10-CM | POA: Diagnosis present

## 2020-12-31 DIAGNOSIS — K430 Incisional hernia with obstruction, without gangrene: Secondary | ICD-10-CM | POA: Diagnosis not present

## 2020-12-31 DIAGNOSIS — Z6837 Body mass index (BMI) 37.0-37.9, adult: Secondary | ICD-10-CM

## 2020-12-31 DIAGNOSIS — R112 Nausea with vomiting, unspecified: Secondary | ICD-10-CM | POA: Diagnosis present

## 2020-12-31 DIAGNOSIS — Z886 Allergy status to analgesic agent status: Secondary | ICD-10-CM

## 2020-12-31 DIAGNOSIS — R509 Fever, unspecified: Secondary | ICD-10-CM | POA: Diagnosis present

## 2020-12-31 DIAGNOSIS — R42 Dizziness and giddiness: Secondary | ICD-10-CM | POA: Diagnosis present

## 2020-12-31 DIAGNOSIS — Z8249 Family history of ischemic heart disease and other diseases of the circulatory system: Secondary | ICD-10-CM

## 2020-12-31 DIAGNOSIS — K660 Peritoneal adhesions (postprocedural) (postinfection): Secondary | ICD-10-CM | POA: Diagnosis present

## 2020-12-31 DIAGNOSIS — G8918 Other acute postprocedural pain: Secondary | ICD-10-CM | POA: Diagnosis not present

## 2020-12-31 DIAGNOSIS — Z9109 Other allergy status, other than to drugs and biological substances: Secondary | ICD-10-CM

## 2020-12-31 LAB — COMPREHENSIVE METABOLIC PANEL
ALT: 14 U/L (ref 0–44)
AST: 18 U/L (ref 15–41)
Albumin: 4 g/dL (ref 3.5–5.0)
Alkaline Phosphatase: 72 U/L (ref 38–126)
Anion gap: 7 (ref 5–15)
BUN: 22 mg/dL — ABNORMAL HIGH (ref 6–20)
CO2: 26 mmol/L (ref 22–32)
Calcium: 9 mg/dL (ref 8.9–10.3)
Chloride: 105 mmol/L (ref 98–111)
Creatinine, Ser: 0.84 mg/dL (ref 0.44–1.00)
GFR, Estimated: >60 mL/min (ref >60)
Glucose, Bld: 117 mg/dL — ABNORMAL HIGH (ref 70–99)
Potassium: 3.8 mmol/L (ref 3.5–5.1)
Sodium: 138 mmol/L (ref 135–145)
Total Bilirubin: 0.7 mg/dL (ref 0.3–1.2)
Total Protein: 7.7 g/dL (ref 6.5–8.1)

## 2020-12-31 LAB — CBC
HCT: 45.2 % (ref 36.0–46.0)
Hemoglobin: 14.9 g/dL (ref 12.0–15.0)
MCH: 28.3 pg (ref 26.0–34.0)
MCHC: 33 g/dL (ref 30.0–36.0)
MCV: 85.9 fL (ref 80.0–100.0)
Platelets: 277 10*3/uL (ref 150–400)
RBC: 5.26 MIL/uL — ABNORMAL HIGH (ref 3.87–5.11)
RDW: 14.5 % (ref 11.5–15.5)
WBC: 10.9 10*3/uL — ABNORMAL HIGH (ref 4.0–10.5)
nRBC: 0 % (ref 0.0–0.2)

## 2020-12-31 LAB — LIPASE, BLOOD: Lipase: 26 U/L (ref 11–51)

## 2020-12-31 LAB — TROPONIN I (HIGH SENSITIVITY): Troponin I (High Sensitivity): 3 ng/L (ref <18)

## 2020-12-31 MED ORDER — ONDANSETRON 4 MG PO TBDP: 4.0000 mg | ORAL_TABLET | Freq: Once | ORAL | Status: DC | PRN

## 2020-12-31 NOTE — ED Notes (Signed)
Pts soiled clothing removed, pt cleaned and hospital gown provided.

## 2020-12-31 NOTE — ED Triage Notes (Addendum)
Pt arrived via EMS from home with N/V/D since eating a cheese omelette this AM. Pt with 1 episode of emesis as well as diarrhea accident on arrival to ED. Umbilical hernia noted to the pts abd. Pt unaware of hernia before today.

## 2021-01-01 ENCOUNTER — Emergency Department: Payer: BC Managed Care – PPO

## 2021-01-01 ENCOUNTER — Inpatient Hospital Stay
Admission: EM | Admit: 2021-01-01 | Discharge: 2021-01-05 | DRG: 337 | Disposition: A | Discharge status: Home / Self Care | Payer: BC Managed Care – PPO | Attending: General Surgery | Admitting: General Surgery

## 2021-01-01 ENCOUNTER — Other Ambulatory Visit: Payer: Self-pay

## 2021-01-01 ENCOUNTER — Encounter
Admission: EM | Disposition: A | Discharge status: Home / Self Care | Payer: Self-pay | Source: Home / Self Care | Attending: General Surgery

## 2021-01-01 ENCOUNTER — Observation Stay: Payer: BC Managed Care – PPO | Admitting: Anesthesiology

## 2021-01-01 ENCOUNTER — Encounter: Payer: Self-pay | Admitting: General Surgery

## 2021-01-01 DIAGNOSIS — Z8249 Family history of ischemic heart disease and other diseases of the circulatory system: Secondary | ICD-10-CM | POA: Diagnosis not present

## 2021-01-01 DIAGNOSIS — R1013 Epigastric pain: Secondary | ICD-10-CM

## 2021-01-01 DIAGNOSIS — Z833 Family history of diabetes mellitus: Secondary | ICD-10-CM | POA: Diagnosis not present

## 2021-01-01 DIAGNOSIS — Z886 Allergy status to analgesic agent status: Secondary | ICD-10-CM | POA: Diagnosis not present

## 2021-01-01 DIAGNOSIS — Z6837 Body mass index (BMI) 37.0-37.9, adult: Secondary | ICD-10-CM | POA: Diagnosis not present

## 2021-01-01 DIAGNOSIS — D72829 Elevated white blood cell count, unspecified: Secondary | ICD-10-CM | POA: Diagnosis present

## 2021-01-01 DIAGNOSIS — R509 Fever, unspecified: Secondary | ICD-10-CM | POA: Diagnosis present

## 2021-01-01 DIAGNOSIS — Z800 Family history of malignant neoplasm of digestive organs: Secondary | ICD-10-CM | POA: Diagnosis not present

## 2021-01-01 DIAGNOSIS — Z20822 Contact with and (suspected) exposure to covid-19: Secondary | ICD-10-CM | POA: Diagnosis present

## 2021-01-01 DIAGNOSIS — K430 Incisional hernia with obstruction, without gangrene: Secondary | ICD-10-CM | POA: Diagnosis present

## 2021-01-01 DIAGNOSIS — G8918 Other acute postprocedural pain: Secondary | ICD-10-CM | POA: Diagnosis not present

## 2021-01-01 DIAGNOSIS — Z9109 Other allergy status, other than to drugs and biological substances: Secondary | ICD-10-CM | POA: Diagnosis not present

## 2021-01-01 DIAGNOSIS — R42 Dizziness and giddiness: Secondary | ICD-10-CM | POA: Diagnosis present

## 2021-01-01 DIAGNOSIS — R112 Nausea with vomiting, unspecified: Secondary | ICD-10-CM | POA: Diagnosis present

## 2021-01-01 DIAGNOSIS — Z79899 Other long term (current) drug therapy: Secondary | ICD-10-CM | POA: Diagnosis not present

## 2021-01-01 DIAGNOSIS — K660 Peritoneal adhesions (postprocedural) (postinfection): Secondary | ICD-10-CM | POA: Diagnosis present

## 2021-01-01 DIAGNOSIS — E669 Obesity, unspecified: Secondary | ICD-10-CM | POA: Diagnosis present

## 2021-01-01 HISTORY — PX: XI ROBOTIC ASSISTED VENTRAL HERNIA: SHX6789

## 2021-01-01 LAB — GLUCOSE, CAPILLARY: Glucose-Capillary: 105 mg/dL — ABNORMAL HIGH (ref 70–99)

## 2021-01-01 LAB — URINALYSIS, COMPLETE (UACMP) WITH MICROSCOPIC
Bacteria, UA: NONE SEEN
Bilirubin Urine: NEGATIVE
Glucose, UA: NEGATIVE mg/dL
Ketones, ur: 5 mg/dL — AB
Leukocytes,Ua: NEGATIVE
Nitrite: NEGATIVE
Protein, ur: NEGATIVE mg/dL
Specific Gravity, Urine: 1.041 — ABNORMAL HIGH (ref 1.005–1.030)
pH: 5 (ref 5.0–8.0)

## 2021-01-01 LAB — RESP PANEL BY RT-PCR (FLU A&B, COVID) ARPGX2
Influenza A by PCR: NEGATIVE
Influenza B by PCR: NEGATIVE
SARS Coronavirus 2 by RT PCR: NEGATIVE

## 2021-01-01 LAB — TROPONIN I (HIGH SENSITIVITY): Troponin I (High Sensitivity): 3 ng/L (ref <18)

## 2021-01-01 SURGERY — REPAIR, HERNIA, VENTRAL, ROBOT-ASSISTED
Anesthesia: General

## 2021-01-01 MED ORDER — SODIUM CHLORIDE FLUSH 0.9 % IV SOLN
INTRAVENOUS | Status: AC
  Filled 2021-01-01: qty 10

## 2021-01-01 MED ORDER — SUGAMMADEX SODIUM 200 MG/2ML IV SOLN
INTRAVENOUS | Status: DC | PRN
  Administered 2021-01-01: 200 mg via INTRAVENOUS

## 2021-01-01 MED ORDER — ONDANSETRON HCL 4 MG/2ML IJ SOLN: 4.0000 mg | Freq: Once | INTRAMUSCULAR | Status: DC | PRN

## 2021-01-01 MED ORDER — GLYCOPYRROLATE 0.2 MG/ML IJ SOLN
INTRAMUSCULAR | Status: DC | PRN
  Administered 2021-01-01: .2 mg via INTRAVENOUS

## 2021-01-01 MED ORDER — ENOXAPARIN SODIUM 60 MG/0.6ML IJ SOSY: 0.5000 mg/kg | PREFILLED_SYRINGE | INTRAMUSCULAR | Status: DC

## 2021-01-01 MED ORDER — PHENYLEPHRINE HCL (PRESSORS) 10 MG/ML IV SOLN
INTRAVENOUS | Status: DC | PRN
  Administered 2021-01-01 (×4): 100 ug via INTRAVENOUS

## 2021-01-01 MED ORDER — ONDANSETRON HCL 4 MG/2ML IJ SOLN
4.0000 mg | Freq: Four times a day (QID) | INTRAMUSCULAR | Status: DC | PRN
  Administered 2021-01-01 – 2021-01-03 (×3): 4 mg via INTRAVENOUS
  Filled 2021-01-01 (×3): qty 2

## 2021-01-01 MED ORDER — POLYETHYLENE GLYCOL 3350 17 G PO PACK
17.0000 g | PACK | Freq: Two times a day (BID) | ORAL | Status: DC
  Administered 2021-01-01 – 2021-01-05 (×8): 17 g via ORAL
  Filled 2021-01-01 (×7): qty 1

## 2021-01-01 MED ORDER — SODIUM CHLORIDE 0.9 % IV SOLN: INTRAVENOUS | Status: DC

## 2021-01-01 MED ORDER — GLYCOPYRROLATE 0.2 MG/ML IJ SOLN
INTRAMUSCULAR | Status: AC
  Filled 2021-01-01: qty 1

## 2021-01-01 MED ORDER — ONDANSETRON 4 MG PO TBDP
4.0000 mg | ORAL_TABLET | Freq: Four times a day (QID) | ORAL | Status: DC | PRN
  Filled 2021-01-01: qty 1

## 2021-01-01 MED ORDER — ACETAMINOPHEN 500 MG PO TABS
1000.0000 mg | ORAL_TABLET | Freq: Once | ORAL | Status: AC
  Administered 2021-01-01: 1000 mg via ORAL
  Filled 2021-01-01: qty 2

## 2021-01-01 MED ORDER — HYDROMORPHONE HCL 1 MG/ML IJ SOLN: 0.5000 mg | Freq: Once | INTRAMUSCULAR | Status: AC

## 2021-01-01 MED ORDER — FENTANYL CITRATE (PF) 100 MCG/2ML IJ SOLN
INTRAMUSCULAR | Status: AC
  Filled 2021-01-01: qty 2

## 2021-01-01 MED ORDER — ACETAMINOPHEN 10 MG/ML IV SOLN: 1000.0000 mg | Freq: Once | INTRAVENOUS | Status: AC

## 2021-01-01 MED ORDER — PROPOFOL 10 MG/ML IV BOLUS
INTRAVENOUS | Status: DC | PRN
  Administered 2021-01-01: 150 mg via INTRAVENOUS

## 2021-01-01 MED ORDER — TRAZODONE HCL 50 MG PO TABS
50.0000 mg | ORAL_TABLET | Freq: Every day | ORAL | Status: DC
  Administered 2021-01-01 – 2021-01-04 (×4): 50 mg via ORAL
  Filled 2021-01-01 (×4): qty 1

## 2021-01-01 MED ORDER — ROCURONIUM BROMIDE 10 MG/ML (PF) SYRINGE
PREFILLED_SYRINGE | INTRAVENOUS | Status: AC
  Filled 2021-01-01: qty 10

## 2021-01-01 MED ORDER — SUCCINYLCHOLINE CHLORIDE 200 MG/10ML IV SOSY
PREFILLED_SYRINGE | INTRAVENOUS | Status: AC
  Filled 2021-01-01: qty 10

## 2021-01-01 MED ORDER — MORPHINE SULFATE (PF) 4 MG/ML IV SOLN
4.0000 mg | Freq: Once | INTRAVENOUS | Status: AC
Start: 2021-01-01 — End: 2021-01-01
  Administered 2021-01-01: 4 mg via INTRAVENOUS
  Filled 2021-01-01: qty 1

## 2021-01-01 MED ORDER — BUPIVACAINE LIPOSOME 1.3 % IJ SUSP
INTRAMUSCULAR | Status: AC
  Filled 2021-01-01: qty 20

## 2021-01-01 MED ORDER — SUCCINYLCHOLINE CHLORIDE 20 MG/ML IJ SOLN
INTRAMUSCULAR | Status: DC | PRN
  Administered 2021-01-01: 120 mg via INTRAVENOUS

## 2021-01-01 MED ORDER — EPHEDRINE SULFATE 50 MG/ML IJ SOLN
INTRAMUSCULAR | Status: DC | PRN
  Administered 2021-01-01: 10 mg via INTRAVENOUS

## 2021-01-01 MED ORDER — ONDANSETRON HCL 4 MG/2ML IJ SOLN
INTRAMUSCULAR | Status: AC
  Filled 2021-01-01: qty 2

## 2021-01-01 MED ORDER — FENTANYL CITRATE (PF) 100 MCG/2ML IJ SOLN
25.0000 ug | INTRAMUSCULAR | Status: DC | PRN
  Administered 2021-01-01 (×3): 25 ug via INTRAVENOUS

## 2021-01-01 MED ORDER — LACTATED RINGERS IV BOLUS
1000.0000 mL | Freq: Once | INTRAVENOUS | Status: AC
  Administered 2021-01-01: 1000 mL via INTRAVENOUS

## 2021-01-01 MED ORDER — ONDANSETRON HCL 4 MG/2ML IJ SOLN
INTRAMUSCULAR | Status: DC | PRN
  Administered 2021-01-01: 4 mg via INTRAVENOUS

## 2021-01-01 MED ORDER — FENTANYL CITRATE (PF) 100 MCG/2ML IJ SOLN
INTRAMUSCULAR | Status: DC | PRN
  Administered 2021-01-01: 25 ug via INTRAVENOUS
  Administered 2021-01-01: 75 ug via INTRAVENOUS
  Administered 2021-01-01 (×2): 50 ug via INTRAVENOUS

## 2021-01-01 MED ORDER — MORPHINE SULFATE (PF) 4 MG/ML IV SOLN
4.0000 mg | INTRAVENOUS | Status: DC | PRN
  Administered 2021-01-01 – 2021-01-03 (×5): 4 mg via INTRAVENOUS
  Filled 2021-01-01 (×6): qty 1

## 2021-01-01 MED ORDER — ACETAMINOPHEN 650 MG RE SUPP: 650.0000 mg | Freq: Four times a day (QID) | RECTAL | Status: DC | PRN

## 2021-01-01 MED ORDER — ONDANSETRON HCL 4 MG/2ML IJ SOLN
4.0000 mg | Freq: Once | INTRAMUSCULAR | Status: AC
  Administered 2021-01-01: 4 mg via INTRAVENOUS
  Filled 2021-01-01: qty 2

## 2021-01-01 MED ORDER — ENOXAPARIN SODIUM 60 MG/0.6ML IJ SOSY
0.5000 mg/kg | PREFILLED_SYRINGE | INTRAMUSCULAR | Status: DC
  Administered 2021-01-02 – 2021-01-04 (×3): 45 mg via SUBCUTANEOUS
  Filled 2021-01-01 (×4): qty 0.6

## 2021-01-01 MED ORDER — FENTANYL CITRATE (PF) 100 MCG/2ML IJ SOLN
INTRAMUSCULAR | Status: AC
  Administered 2021-01-01: 25 ug via INTRAVENOUS
  Filled 2021-01-01: qty 2

## 2021-01-01 MED ORDER — PROPOFOL 10 MG/ML IV BOLUS
INTRAVENOUS | Status: AC
  Filled 2021-01-01: qty 20

## 2021-01-01 MED ORDER — SODIUM CHLORIDE (PF) 0.9 % IJ SOLN
INTRAMUSCULAR | Status: AC
  Filled 2021-01-01: qty 50

## 2021-01-01 MED ORDER — ACETAMINOPHEN 10 MG/ML IV SOLN
INTRAVENOUS | Status: AC
  Administered 2021-01-01: 1000 mg via INTRAVENOUS
  Filled 2021-01-01: qty 100

## 2021-01-01 MED ORDER — DEXAMETHASONE SODIUM PHOSPHATE 10 MG/ML IJ SOLN
INTRAMUSCULAR | Status: AC
  Filled 2021-01-01: qty 1

## 2021-01-01 MED ORDER — BUPIVACAINE-EPINEPHRINE (PF) 0.25% -1:200000 IJ SOLN
INTRAMUSCULAR | Status: DC | PRN
  Administered 2021-01-01: 30 mL

## 2021-01-01 MED ORDER — PIPERACILLIN-TAZOBACTAM 3.375 G IVPB
3.3750 g | Freq: Three times a day (TID) | INTRAVENOUS | Status: DC
  Administered 2021-01-01 – 2021-01-02 (×4): 3.375 g via INTRAVENOUS
  Filled 2021-01-01 (×4): qty 50

## 2021-01-01 MED ORDER — ROCURONIUM BROMIDE 100 MG/10ML IV SOLN
INTRAVENOUS | Status: DC | PRN
  Administered 2021-01-01: 20 mg via INTRAVENOUS
  Administered 2021-01-01 (×2): 10 mg via INTRAVENOUS
  Administered 2021-01-01: 30 mg via INTRAVENOUS

## 2021-01-01 MED ORDER — DEXAMETHASONE SODIUM PHOSPHATE 10 MG/ML IJ SOLN
INTRAMUSCULAR | Status: DC | PRN
  Administered 2021-01-01: 10 mg via INTRAVENOUS

## 2021-01-01 MED ORDER — HYDROMORPHONE HCL 1 MG/ML IJ SOLN
INTRAMUSCULAR | Status: AC
  Administered 2021-01-01: 0.5 mg via INTRAVENOUS
  Filled 2021-01-01: qty 1

## 2021-01-01 MED ORDER — PANTOPRAZOLE SODIUM 40 MG PO TBEC
40.0000 mg | DELAYED_RELEASE_TABLET | Freq: Two times a day (BID) | ORAL | Status: DC
  Administered 2021-01-01 – 2021-01-05 (×8): 40 mg via ORAL
  Filled 2021-01-01 (×8): qty 1

## 2021-01-01 MED ORDER — LIDOCAINE HCL (CARDIAC) PF 100 MG/5ML IV SOSY
PREFILLED_SYRINGE | INTRAVENOUS | Status: DC | PRN
  Administered 2021-01-01: 75 mg via INTRAVENOUS

## 2021-01-01 MED ORDER — IOHEXOL 300 MG/ML  SOLN
100.0000 mL | Freq: Once | INTRAMUSCULAR | Status: AC | PRN
  Administered 2021-01-01: 100 mL via INTRAVENOUS

## 2021-01-01 MED ORDER — BUPIVACAINE-EPINEPHRINE (PF) 0.25% -1:200000 IJ SOLN
INTRAMUSCULAR | Status: AC
  Filled 2021-01-01: qty 30

## 2021-01-01 MED ORDER — SODIUM CHLORIDE 0.9 % IV SOLN
INTRAVENOUS | Status: DC | PRN
  Administered 2021-01-01: 70 mL

## 2021-01-01 MED ORDER — ACETAMINOPHEN 325 MG PO TABS: 650.0000 mg | ORAL_TABLET | Freq: Four times a day (QID) | ORAL | Status: DC | PRN

## 2021-01-01 MED ORDER — HYDROCODONE-ACETAMINOPHEN 5-325 MG PO TABS
1.0000 | ORAL_TABLET | ORAL | Status: DC | PRN
Start: 2021-01-01 — End: 2021-01-05
  Administered 2021-01-01 – 2021-01-02 (×3): 2 via ORAL
  Administered 2021-01-02: 1 via ORAL
  Administered 2021-01-03 – 2021-01-04 (×6): 2 via ORAL
  Filled 2021-01-01 (×3): qty 2
  Filled 2021-01-01: qty 1
  Filled 2021-01-01 (×7): qty 2

## 2021-01-01 SURGICAL SUPPLY — 50 items
BLADE SURG SZ11 CARB STEEL (BLADE) ×2 IMPLANT
CANISTER SUCT 1200ML W/VALVE (MISCELLANEOUS) IMPLANT
CANNULA REDUC XI 12-8 STAPL (CANNULA) ×1
CANNULA REDUCER 12-8 DVNC XI (CANNULA) ×1 IMPLANT
CHLORAPREP W/TINT 26 (MISCELLANEOUS) ×2 IMPLANT
COVER TIP SHEARS 8 DVNC (MISCELLANEOUS) ×1 IMPLANT
COVER TIP SHEARS 8MM DA VINCI (MISCELLANEOUS) ×1
DEFOGGER SCOPE WARMER CLEARIFY (MISCELLANEOUS) ×2 IMPLANT
DERMABOND ADVANCED (GAUZE/BANDAGES/DRESSINGS) ×1
DERMABOND ADVANCED .7 DNX12 (GAUZE/BANDAGES/DRESSINGS) ×1 IMPLANT
DRAPE ARM DVNC X/XI (DISPOSABLE) ×3 IMPLANT
DRAPE COLUMN DVNC XI (DISPOSABLE) ×1 IMPLANT
DRAPE DA VINCI XI ARM (DISPOSABLE) ×3
DRAPE DA VINCI XI COLUMN (DISPOSABLE) ×1
ELECT REM PT RETURN 9FT ADLT (ELECTROSURGICAL) ×2
ELECTRODE REM PT RTRN 9FT ADLT (ELECTROSURGICAL) ×1 IMPLANT
GAUZE 4X4 16PLY ~~LOC~~+RFID DBL (SPONGE) ×2 IMPLANT
GLOVE SURG ENC MOIS LTX SZ6.5 (GLOVE) ×4 IMPLANT
GLOVE SURG UNDER POLY LF SZ6.5 (GLOVE) ×4 IMPLANT
GOWN STRL REUS W/ TWL LRG LVL3 (GOWN DISPOSABLE) ×3 IMPLANT
GOWN STRL REUS W/TWL LRG LVL3 (GOWN DISPOSABLE) ×3
GRASPER SUT TROCAR 14GX15 (MISCELLANEOUS) ×2 IMPLANT
IRRIGATOR SUCT 8 DISP DVNC XI (IRRIGATION / IRRIGATOR) IMPLANT
IRRIGATOR SUCTION 8MM XI DISP (IRRIGATION / IRRIGATOR)
IV NS 1000ML (IV SOLUTION)
IV NS 1000ML BAXH (IV SOLUTION) IMPLANT
KIT PINK PAD W/HEAD ARE REST (MISCELLANEOUS) ×2 IMPLANT
KIT PINK PAD W/HEAD ARM REST (MISCELLANEOUS) ×1 IMPLANT
LABEL OR SOLS (LABEL) ×2 IMPLANT
MANIFOLD NEPTUNE II (INSTRUMENTS) IMPLANT
MESH VENTRALIGHT ST 6X8 (Mesh Specialty) ×1 IMPLANT
MESH VENTRLGHT ELLIPSE 8X6XMFL (Mesh Specialty) ×1 IMPLANT
NEEDLE HYPO 22GX1.5 SAFETY (NEEDLE) ×2 IMPLANT
NEEDLE INSUFFLATION 14GA 120MM (NEEDLE) ×2 IMPLANT
OBTURATOR OPTICAL STANDARD 8MM (TROCAR) ×1
OBTURATOR OPTICAL STND 8 DVNC (TROCAR) ×1
OBTURATOR OPTICALSTD 8 DVNC (TROCAR) ×1 IMPLANT
PACK LAP CHOLECYSTECTOMY (MISCELLANEOUS) ×2 IMPLANT
SEAL CANN UNIV 5-8 DVNC XI (MISCELLANEOUS) ×3 IMPLANT
SEAL XI 5MM-8MM UNIVERSAL (MISCELLANEOUS) ×3
SET TUBE SMOKE EVAC HIGH FLOW (TUBING) ×2 IMPLANT
SLEEVE SCD COMPRESS KNEE LRG (STOCKING) ×2 IMPLANT
SOLUTION ELECTROLUBE (MISCELLANEOUS) ×2 IMPLANT
SUT MNCRL AB 4-0 PS2 18 (SUTURE) ×2 IMPLANT
SUT STRATAFIX PDS 30 CT-1 (SUTURE) ×4 IMPLANT
SUT VICRYL 0 AB UR-6 (SUTURE) ×2 IMPLANT
SUT VLOC 90 2/L VL 12 GS22 (SUTURE) ×10 IMPLANT
SYR 20ML LL LF (SYRINGE) ×4 IMPLANT
TAPE TRANSPORE STRL 2 31045 (GAUZE/BANDAGES/DRESSINGS) ×2 IMPLANT
TRAY FOLEY MTR SLVR 16FR STAT (SET/KITS/TRAYS/PACK) ×2 IMPLANT

## 2021-01-01 NOTE — H&P (Signed)
SURGICAL HISTORY AND PHYSICAL NOTE   HISTORY OF PRESENT ILLNESS (HPI):  60 y.o. female presented to [facility] ED for evaluation of abdominal pain since yesterday. Patient reports starting having abdominal pain since yesterday night.  The patient reported the pain is localized in the supraumbilical area.  The pain does not radiate to other part of the body.  Aggravating pain is applying pressure.  There has been no alleviating pain.  She report associated nausea and vomiting.  She had a low-grade fever 100.6.  Pain has been getting more intense with time.  She came to the ED and she was found with mild leukocytosis.  Due to the severe abdominal pain she had a CT scan that shows incarcerated incisional hernia with fat content.  No bowel identified.  I personally evaluated the images.  No sign of obstruction.  Surgery is consulted by Dr. Katrinka Blazing in this context for evaluation and management of incarcerated incisional hernia.  PAST MEDICAL HISTORY (PMH):  History reviewed. No pertinent past medical history.   PAST SURGICAL HISTORY (PSH):  Past Surgical History:  Procedure Laterality Date   ABDOMINAL HYSTERECTOMY     ESOPHAGOGASTRODUODENOSCOPY (EGD) WITH PROPOFOL N/A 08/17/2017   Procedure: ESOPHAGOGASTRODUODENOSCOPY (EGD) WITH PROPOFOL;  Surgeon: [name], MD;  Location: [facility] ENDOSCOPY;  Service: Gastroenterology;  Laterality: N/A;   HERNIA REPAIR     none       MEDICATIONS:  Prior to Admission medications   Medication Sig Start Date End Date Taking? Authorizing Provider  desipramine (NORPRAMIN) 25 MG tablet Take 1 tablet (25 mg total) by mouth at bedtime. 09/06/17 12/05/17  Toney Reil, MD  pantoprazole (PROTONIX) 40 MG tablet Take 1 tablet (40 mg total) by mouth 2 (two) times daily before a meal. Patient not taking: No sig reported 06/01/17   Enid Baas, MD  traZODone (DESYREL) 50 MG tablet Take by mouth. 03/16/17   [provider]     ALLERGIES:  Allergies  Allergen  Reactions   Ibuprofen     Pt states has ulcers     SOCIAL HISTORY:  Social History   Socioeconomic History   Marital status: Married    Spouse name: Not on file   Number of children: Not on file   Years of education: Not on file   Highest education level: Not on file  Occupational History    Employer: [employer]  Tobacco Use   Smoking status: Never   Smokeless tobacco: Never  Vaping Use   Vaping Use: Never used  Substance and Sexual Activity   Alcohol use: No   Drug use: No   Sexual activity: Not on file  Other Topics Concern   Not on file  Social History Narrative   Not on file   Social Determinants of Health   Financial Resource Strain: Not on file  Food Insecurity: Not on file  Transportation Needs: Not on file  Physical Activity: Not on file  Stress: Not on file  Social Connections: Not on file  Intimate Partner Violence: Not on file      FAMILY HISTORY:  Family History  Problem Relation Age of Onset   Heart failure Mother      REVIEW OF SYSTEMS:  Constitutional: denies weight loss, fever, chills, or sweats  Eyes: denies any other vision changes, history of eye injury  ENT: denies sore throat, hearing problems  Respiratory: denies shortness of breath, wheezing  Cardiovascular: denies chest pain, palpitations  Gastrointestinal: Positive abdominal pain, nausea and vomiting Genitourinary: denies  burning with urination or urinary frequency Musculoskeletal: denies any other joint pains or cramps  Skin: denies any other rashes or skin discolorations  Neurological: denies any other headache, dizziness, weakness  Psychiatric: denies any other depression, anxiety   All other review of systems were negative   VITAL SIGNS:  Temp:  [98.5 F (36.9 C)-100.6 F (38.1 C)] 98.5 F (36.9 C) (07/13 0103) Pulse Rate:  [65-97] 65 (07/13 0300) Resp:  [18] 18 (07/13 0300) BP: (113-148)/(61-98) 113/66 (07/13 0300) SpO2:  [99 %-100 %] 100 % (07/13 0300) Weight:   [89 kg] 89 kg (07/12 2042)     Height: 5\' 1"  (154.9 cm) Weight: 89 kg BMI (Calculated): 37.09   INTAKE/OUTPUT:  This shift: Total I/O In: 999 [IV Piggyback:999] Out: -   Last 2 shifts: @IOLAST2SHIFTS @   PHYSICAL EXAM:  Constitutional:  -- Normal body habitus  -- Awake, alert, and oriented x3  Eyes:  -- Pupils equally round and reactive to light  -- No scleral icterus  Ear, nose, and throat:  -- No jugular venous distension  Pulmonary:  -- No crackles  -- Equal breath sounds bilaterally -- Breathing non-labored at rest Cardiovascular:  -- S1, S2 present  -- No pericardial rubs Gastrointestinal:  -- Abdomen soft, exquisitely tender in the supraumbilical area, non-distended, no guarding or rebound tenderness -- No abdominal masses appreciated, pulsatile or otherwise  Musculoskeletal and Integumentary:  -- Wounds: None appreciated -- Extremities: B/L UE and LE FROM, hands and feet warm, no edema  Neurologic:  -- Motor function: intact and symmetric -- Sensation: intact and symmetric   Labs:  CBC Latest Ref Rng & Units 12/31/2020 06/01/2017 05/31/2017  WBC 4.0 - 10.5 K/uL 10.9(H) 6.5 10.2  Hemoglobin 12.0 - 15.0 g/dL 14/04/2017 14/03/2017 26.7  Hematocrit 36.0 - 46.0 % 45.2 38.8 40.4  Platelets 150 - 400 K/uL 277 283 293   CMP Latest Ref Rng & Units 12/31/2020 06/01/2017 05/31/2017  Glucose 70 - 99 mg/dL 14/04/2017) 14/03/2017) 998(P)  BUN 6 - 20 mg/dL 382(N) 13 16  Creatinine 0.44 - 1.00 mg/dL 053(Z 76(B) 3.41  Sodium 135 - 145 mmol/L 138 140 139  Potassium 3.5 - 5.1 mmol/L 3.8 3.6 3.3(L)  Chloride 98 - 111 mmol/L 105 108 109  CO2 22 - 32 mmol/L 26 25 23   Calcium 8.9 - 10.3 mg/dL 9.0 9.37(T) 8.1(L)  Total Protein 6.5 - 8.1 g/dL 7.7 - -  Total Bilirubin 0.3 - 1.2 mg/dL 0.7 - -  Alkaline Phos 38 - 126 U/L 72 - -  AST 15 - 41 U/L 18 - -  ALT 0 - 44 U/L 14 - -    Imaging studies:  EXAM: CT ABDOMEN AND PELVIS WITH CONTRAST   TECHNIQUE: Multidetector CT imaging of the abdomen and pelvis  was performed using the standard protocol following bolus administration of intravenous contrast.   CONTRAST:  0.24 OMNIPAQUE IOHEXOL 300 MG/ML  SOLN   COMPARISON:  05/30/2017   FINDINGS: Lower chest: No acute abnormality.   Hepatobiliary: Few small cysts are noted within the liver. The gallbladder is within normal limits.   Pancreas: Unremarkable. No pancreatic ductal dilatation or surrounding inflammatory changes.   Spleen: Normal in size without focal abnormality.   Adrenals/Urinary Tract: Adrenal glands are within normal limits. Kidneys demonstrate a normal enhancement pattern bilaterally. No renal calculi or obstructive changes are seen. Normal excretion is noted on delayed images. The ureters are within normal limits bilaterally. The bladder is well distended.   Stomach/Bowel: Colon shows  no obstructive or inflammatory changes. The appendix is within normal limits. Stomach is unremarkable. Small bowel shows some fluid-filled loops although no obstructive changes are seen.   Vascular/Lymphatic: No significant vascular findings are present. No enlarged abdominal or pelvic lymph nodes.   Reproductive: Uterus and bilateral adnexa are unremarkable.   Other: Supraumbilical fat containing hernia is seen somewhat bilobed in nature measuring approximately 3 cm at the mouth of the hernia. No bowel is noted within.   Musculoskeletal: Degenerative changes of the left hip joint and lumbar spine are seen. No acute bony abnormality is noted.   IMPRESSION: No evidence of bowel obstruction.   Supraumbilical fat filled hernia with a somewhat bilobed appearance. No bowel is noted within.   Normal-appearing appendix.     Electronically Signed   By: Alcide Clever M.D.   On: 01/01/2021 02:23  Assessment/Plan:  60 y.o. female with incarcerated incisional hernia with concern of early strangulation of fat containing tissue, complicated by pertinent comorbidities including  gastritis, obesity.  Patient with incarcerated recurrent incisional hernia.  This was fixed more than 20 years ago.  The patient has been having increasing intense localized exquisite pain in the supraumbilical area.  This is very sensitive to touch.  I was unable to reduce the hernia due to the pain.  Also on CT scan, the hernia seems to be multiple.  I discussed with the patient that the fact that she is having exquisite pain on the supraumbilical area with CT scan finding of fat-containing hernia with stranding there is a concern of early strangulation of the fat tissue.  My recommendation is to proceed with emergent repair of the hernia.  I discussed with the patient the risk of surgery that includes injury to intestine, intestinal fistula, injury to other organs, bleeding, infection, pain, among others.  The patient reports he understood and agreed to proceed.  Gae Gallop, MD

## 2021-01-01 NOTE — Transfer of Care (Signed)
Immediate Anesthesia Transfer of Care Note  Patient: Kathryn Bryan  Procedure(s) Performed: XI ROBOTIC ASSISTED VENTRAL HERNIA REPAIR  Patient Location: PACU  Anesthesia Type:General  Level of Consciousness: awake, drowsy and patient cooperative  Airway & Oxygen Therapy: Patient Spontanous Breathing and Patient connected to face mask oxygen  Post-op Assessment: Report given to RN and Post -op Vital signs reviewed and stable  Post vital signs: Reviewed and stable  Last Vitals:  Vitals Value Taken Time  BP 136/58 01/01/21 0745  Temp 36.6 C 01/01/21 0745  Pulse 93 01/01/21 0749  Resp 18 01/01/21 0749  SpO2 100 % 01/01/21 0749  Vitals shown include unvalidated device data.  Last Pain:  Vitals:   01/01/21 0209  TempSrc:   PainSc: 4          Complications: No notable events documented.

## 2021-01-01 NOTE — ED Notes (Addendum)
Consent signed by the patient and witnessed by this RN. Consent along with stickers given to ED Tech for patient transport.   Pts yellow colored jewlery placed in a bag with patient label and given to the patients husband.

## 2021-01-01 NOTE — Progress Notes (Signed)
PHARMACIST - PHYSICIAN COMMUNICATION  CONCERNING:  Enoxaparin (Lovenox) for DVT Prophylaxis    RECOMMENDATION: Patient was prescribed enoxaprin 40mg  q24 hours for VTE prophylaxis.   Filed Weights   12/31/20 2042  Weight: 89 kg (196 lb 3.4 oz)    Body mass index is 37.07 kg/m.  Estimated Creatinine Clearance: 72.3 mL/min (by C-G formula based on SCr of 0.84 mg/dL).   Based on [facility] policy patient is candidate for enoxaparin 0.5mg /kg TBW SQ every 24 hours based on BMI being >30.  DESCRIPTION: Pharmacy has adjusted enoxaparin dose per [facility] policy.  Patient is now receiving enoxaparin 0.5 mg/kg every 24 hours   [facility], PharmD, [facility] 01/01/2021 4:13 AM

## 2021-01-01 NOTE — ED Provider Notes (Signed)
[facility] Emergency Department Provider Note ____________________________________________   Event Date/Time   First MD Initiated Contact with Patient 01/01/21 0032     (approximate)  I have reviewed the triage vital signs and the nursing notes.  HISTORY  Chief Complaint Emesis and Nausea   HPI Kathryn Bryan is a 60 y.o. femalewho presents to the ED for evaluation of N/V/D  Chart review indicates hx obesity  Patient presents to the ED for evaluation of less than 24 hours of epigastric discomfort, nausea, vomiting and diarrhea.  She reports symptoms starting about 30 minutes after eating a cheese omelette this morning with nausea and epigastric discomfort.  She reports copious watery diarrhea throughout the day today and one episode of nonbloody nonbilious emesis.  She reports "just feeling awful" and rather generalized fashion as well as "just feeling really bad" to her epigastric abdomen.  She reports some presyncopal lightheaded dizziness without syncope.  Denies dysuria or urinary symptoms.   History reviewed. No pertinent past medical history.  Patient Active Problem List   Diagnosis Date Noted   Incarcerated incisional hernia 01/01/2021   Morbid (severe) obesity due to excess calories (HCC) 06/16/2017   Nausea & vomiting 05/31/2017   FH: diabetes mellitus 07/01/2015   Lumbago without sciatica 09/10/2014   Spondylosis of lumbar region without myelopathy or radiculopathy 09/10/2014   FH: colon cancer 01/15/2014   Enthesopathy of hip region 08/17/2012   Cyst of Bartholin's gland duct 08/17/2011   Lattice degeneration 04/10/2011   Open-angle glaucoma 05/04/2005   Acute gastritis 04/01/2004   Toxic diffuse goiter 10/24/2003    Past Surgical History:  Procedure Laterality Date   ABDOMINAL HYSTERECTOMY     ESOPHAGOGASTRODUODENOSCOPY (EGD) WITH PROPOFOL N/A 08/17/2017   Procedure: ESOPHAGOGASTRODUODENOSCOPY (EGD) WITH PROPOFOL;  Surgeon: [name], MD;  Location: [facility] ENDOSCOPY;  Service: Gastroenterology;  Laterality: N/A;   HERNIA REPAIR     none      Prior to Admission medications   Medication Sig Start Date End Date Taking? Authorizing Provider  desipramine (NORPRAMIN) 25 MG tablet Take 1 tablet (25 mg total) by mouth at bedtime. 09/06/17 12/05/17  Toney Reil, MD  pantoprazole (PROTONIX) 40 MG tablet Take 1 tablet (40 mg total) by mouth 2 (two) times daily before a meal. Patient not taking: No sig reported 06/01/17   Enid Baas, MD  traZODone (DESYREL) 50 MG tablet Take by mouth. 03/16/17   [provider]    Allergies Ibuprofen  Family History  Problem Relation Age of Onset   Heart failure Mother     Social History Social History   Tobacco Use   Smoking status: Never   Smokeless tobacco: Never  Vaping Use   Vaping Use: Never used  Substance Use Topics   Alcohol use: No   Drug use: No    Review of Systems  Constitutional: No fever/chills Eyes: No visual changes. ENT: No sore throat. Cardiovascular: Denies chest pain. Respiratory: Denies shortness of breath. Gastrointestinal: Positive for abdominal pain, nausea, vomiting and diarrhea  No constipation. Genitourinary: Negative for dysuria. Musculoskeletal: Negative for back pain. Skin: Negative for rash. Neurological: Negative for headaches, focal weakness or numbness.  ____________________________________________   PHYSICAL EXAM:  VITAL SIGNS: Vitals:   01/01/21 0209 01/01/21 0300  BP:  113/66  Pulse: 76 65  Resp:  18  Temp:    SpO2: 100% 100%     Constitutional: Alert and oriented.  Appears uncomfortable, but in no acute distress Eyes: Conjunctivae are  normal. PERRL. EOMI. Head: Atraumatic. Nose: No congestion/rhinnorhea. Mouth/Throat: Mucous membranes are moist.  Oropharynx non-erythematous. Neck: No stridor. No cervical spine tenderness to palpation. Cardiovascular: Normal rate, regular rhythm. Grossly  normal heart sounds.  Good peripheral circulation. Respiratory: Normal respiratory effort.  No retractions. Lungs CTAB. Gastrointestinal: Soft , nondistended. No CVA tenderness. Mild epigastric and RUQ tenderness to palpation without peritoneal features.  Abdomen is otherwise benign. Musculoskeletal: No lower extremity tenderness nor edema.  No joint effusions. No signs of acute trauma. Neurologic:  Normal speech and language. No gross focal neurologic deficits are appreciated. No gait instability noted. Skin:  Skin is warm, dry and intact. No rash noted. Psychiatric: Mood and affect are normal. Speech and behavior are normal.  ____________________________________________   LABS (all labs ordered are listed, but only abnormal results are displayed)  Labs Reviewed  COMPREHENSIVE METABOLIC PANEL - Abnormal; Notable for the following components:      Result Value   Glucose, Bld 117 (*)    BUN 22 (*)    All other components within normal limits  CBC - Abnormal; Notable for the following components:   WBC 10.9 (*)    RBC 5.26 (*)    All other components within normal limits  RESP PANEL BY RT-PCR (FLU A&B, COVID) ARPGX2  LIPASE, BLOOD  URINALYSIS, COMPLETE (UACMP) WITH MICROSCOPIC  HIV ANTIBODY (ROUTINE TESTING W REFLEX)  TROPONIN I (HIGH SENSITIVITY)  TROPONIN I (HIGH SENSITIVITY)   ____________________________________________  12 Lead EKG  Sinus rhythm, rate of 100 bpm.  Normal axis and intervals.  No evidence of acute ischemia. ____________________________________________  RADIOLOGY  ED MD interpretation: CXR reviewed by me without evidence of acute cardiopulmonary pathology. CT reviewed by me with fat-containing ventral incisional hernia without stigmata of SBO  Official radiology report(s): DG Chest 2 View  Result Date: 01/01/2021 CLINICAL DATA:  Chest pain and epigastric pain EXAM: CHEST - 2 VIEW COMPARISON:  None. FINDINGS: The heart size and mediastinal contours are  within normal limits. Both lungs are clear. The visualized skeletal structures are unremarkable. IMPRESSION: No active cardiopulmonary disease. Electronically Signed   By: Alcide Clever M.D.   On: 01/01/2021 02:19   CT ABDOMEN PELVIS W CONTRAST  Result Date: 01/01/2021 CLINICAL DATA:  Periumbilical pain, history of umbilical hernia. EXAM: CT ABDOMEN AND PELVIS WITH CONTRAST TECHNIQUE: Multidetector CT imaging of the abdomen and pelvis was performed using the standard protocol following bolus administration of intravenous contrast. CONTRAST:  OMNIPAQUE IOHEXOL 300 MG/ML  SOLN COMPARISON:  05/30/2017 FINDINGS: Lower chest: No acute abnormality. Hepatobiliary: Few small cysts are noted within the liver. The gallbladder is within normal limits. Pancreas: Unremarkable. No pancreatic ductal dilatation or surrounding inflammatory changes. Spleen: Normal in size without focal abnormality. Adrenals/Urinary Tract: Adrenal glands are within normal limits. Kidneys demonstrate a normal enhancement pattern bilaterally. No renal calculi or obstructive changes are seen. Normal excretion is noted on delayed images. The ureters are within normal limits bilaterally. The bladder is well distended. Stomach/Bowel: Colon shows no obstructive or inflammatory changes. The appendix is within normal limits. Stomach is unremarkable. Small bowel shows some fluid-filled loops although no obstructive changes are seen. Vascular/Lymphatic: No significant vascular findings are present. No enlarged abdominal or pelvic lymph nodes. Reproductive: Uterus and bilateral adnexa are unremarkable. Other: Supraumbilical fat containing hernia is seen somewhat bilobed in nature measuring approximately 3 cm at the mouth of the hernia. No bowel is noted within. Musculoskeletal: Degenerative changes of the left hip joint and lumbar spine are seen.  No acute bony abnormality is noted. IMPRESSION: No evidence of bowel obstruction. Supraumbilical fat filled  hernia with a somewhat bilobed appearance. No bowel is noted within. Normal-appearing appendix. Electronically Signed   By: Alcide Clever M.D.   On: 01/01/2021 02:23    ____________________________________________   PROCEDURES and INTERVENTIONS  Procedure(s) performed (including Critical Care):  .1-3 Lead EKG Interpretation  Date/Time: 01/01/2021 4:02 AM Performed by: Delton Prairie, MD Authorized by: Delton Prairie, MD     Interpretation: normal     ECG rate:  70   ECG rate assessment: normal     Rhythm: sinus rhythm     Ectopy: none     Conduction: normal    Medications  traZODone (DESYREL) tablet 50 mg (has no administration in time range)  piperacillin-tazobactam (ZOSYN) IVPB 3.375 g (has no administration in time range)  enoxaparin (LOVENOX) injection 40 mg (has no administration in time range)  0.9 %  sodium chloride infusion (has no administration in time range)  acetaminophen (TYLENOL) tablet 650 mg (has no administration in time range)    Or  acetaminophen (TYLENOL) suppository 650 mg (has no administration in time range)  HYDROcodone-acetaminophen (NORCO/VICODIN) 5-325 MG per tablet 1-2 tablet (has no administration in time range)  morphine 4 MG/ML injection 4 mg (has no administration in time range)  ondansetron (ZOFRAN-ODT) disintegrating tablet 4 mg (has no administration in time range)    Or  ondansetron (ZOFRAN) injection 4 mg (has no administration in time range)  pantoprazole (PROTONIX) EC tablet 40 mg (has no administration in time range)  lactated ringers bolus 1,000 mL (0 mLs Intravenous Stopped 01/01/21 0207)  ondansetron (ZOFRAN) injection 4 mg (4 mg Intravenous Given 01/01/21 0107)  morphine 4 MG/ML injection 4 mg (4 mg Intravenous Given 01/01/21 0107)  acetaminophen (TYLENOL) tablet 1,000 mg (1,000 mg Oral Given 01/01/21 0107)  iohexol (OMNIPAQUE) 300 MG/ML solution 100 mL (100 mLs Intravenous Contrast Given 01/01/21 0138)     ____________________________________________   MDM / ED COURSE   60 year old woman to the ED with abdominal pain, emesis and diarrhea with evidence of an incarcerated fat-containing incisional hernia, requiring surgical admission.  She presents with low-grade fever of 100.6 F, but remains hemodynamically stable throughout her stay in the ED.  Exam with epigastric and periumbilical tenderness to palpation, just deep to her well-healed supraumbilical midline surgical incision from a remote hernia repair.  Blood work with mild nonspecific leukocytosis, otherwise unremarkable.  CT obtained and demonstrates fat-containing hernia without SBO.  I am unable to reduce this hernia and she is still quite tender and symptomatic.  Discussed the case with surgery who agrees to admit.  No peritonitis or SBO to necessitate antibiotics in the ED.  Clinical Course as of 01/01/21 0402  Wed Jan 01, 2021  0319 Reassessed.  Still quite tender.  Educated patient of CT results and my concern for incarcerated fat-containing incisional hernia.  Considering her presenting fever, leukocytosis, continued symptoms I am concerned about incarcerated fat-containing hernia.  I will page surgery to discuss with them [DS]  0322 I speak with Dr. Maia Plan, who will review Cts and call me back [DS]  0338 Cintron at the bedside evaluating the patient [DS]    Clinical Course User Index [DS] Delton Prairie, MD    ____________________________________________   FINAL CLINICAL IMPRESSION(S) / ED DIAGNOSES  Final diagnoses:  Incarcerated incisional hernia  Epigastric pain     ED Discharge Orders     None  Delton Prairieylan Lilyian Quayle   Note:  This document was prepared using Conservation officer, historic buildingsDragon voice recognition software and may include unintentional dictation errors.    Delton PrairieSmith, Annaliyah Willig, MD 01/01/21 [phone]

## 2021-01-01 NOTE — Anesthesia Procedure Notes (Signed)
Procedure Name: Intubation Date/Time: 01/01/2021 5:00 AM Performed by: Waldo Laine, CRNA Pre-anesthesia Checklist: Patient identified, Patient being monitored, Timeout performed, Emergency Drugs available and Suction available Patient Re-evaluated:Patient Re-evaluated prior to induction Oxygen Delivery Method: Circle system utilized Preoxygenation: Pre-oxygenation with 100% oxygen Induction Type: IV induction and Rapid sequence Laryngoscope Size: McGraph and 4 Tube type: Oral Tube size: 7.0 mm Number of attempts: 1 Placement Confirmation: positive ETCO2 and breath sounds checked- equal and bilateral Secured at: 21 cm Tube secured with: Tape Dental Injury: Teeth and Oropharynx as per pre-operative assessment

## 2021-01-01 NOTE — Op Note (Signed)
Preoperative diagnosis: Incarcerated Recurrent Incisional Hernia  Postoperative diagnosis: Incarcerated Recurrent Incisional Hernia  Procedure: Robotic assisted laparoscopic hernia repair with mesh  Anesthesia: General  Surgeon: Dr. Hazle Quant  Wound Classification: Clean  Specimen: None  Complications: None  Estimated Blood Loss: 16ml  Indications: Patient is a 60 y.o. female developed a recurrent incisional hernia. This was incarcerated with concern of strangulation and repair was indicated.   Findings: Multiple (swiss cheese) incisional hernia with a total of 8 x 5 cm hernia 2. Repair achieved with closure of the anterior fascia at midline and 20 cm x 15 cm Bard mesh 3. No strangulated fat tissue 4. Adequate hemostasis  Description of procedure: The patient was brought to the operating room and general anesthesia was induced. A time-out was completed verifying correct patient, procedure, site, positioning, and implant(s) and/or special equipment prior to beginning this procedure. Antibiotics were administered prior to making the incision. SCDs placed. The anterior abdominal wall was prepped and draped in the standard sterile fashion.   Palmer's point chosen for entry.  Veress needle placed and abdomen insufflated to 15cm without any dramatic increase in pressure.  Needle removed and optiview technique used to place 17mm port at same point.  No injury noted during placement. Exparel was infused in a TAP block. Two additional ports, 5mm x2 along left lateral aspect placed.  Left upper trocar exchanged for a 12 mm trocar. Xi robot then docked into place.  Extensive and time consuming lysis of adhesions was done. Hernia contents noted and reduced with combination of blunt, sharp dissection with scissors and fenestrated forceps.  Hemostasis achieved throughout this portion.  Once all hernia contents reduced, there was noted to be a swiss cheese defects.    Insufflation dropped to 8 mm  and transfacial suture with 0 stratafix used to primarily close defects. Bard echo plus protected 20 x 15 cm mesh was placed within the abdominal cavity through 52mm port and secured to the abdominal wall centered over the defect using the 0 stratafix previously used to primarily close defect.  The mesh was then circumferentially sutured into the anterior abdominal wall using 2-0 VLock x4.  Any bleeding noted during this portion was no longer actively bleeding by end of securing mesh and tightening the suture.    Robot was undocked.  The 17mm cannula was removed and port site was closed using PMI device and 0 vicryl suture, ensuring no bowels were injured during this process.  Abdomen then desufflated while camera within abdomen to ensure no signs of new bleed prior to removing camera and rest of ports completely.  12 mm port site skin closed using 3-0 Vicryl for the deep dermal layer in an interrupted fashion and  All skin incisions closed with runninrg 4-0 Monocryl in a subcuticular fashion.  All wounds then dressed with Dermabond.  Patient was then successfully awakened and transferred to PACU in stable condition.  At the end of the procedure sponge and instrument counts were correct.

## 2021-01-01 NOTE — Anesthesia Postprocedure Evaluation (Signed)
Anesthesia Post Note  Patient: Setsuko C Evilsizer  Procedure(s) Performed: XI ROBOTIC ASSISTED VENTRAL HERNIA REPAIR  Patient location during evaluation: PACU Anesthesia Type: General Level of consciousness: awake and alert Pain management: pain level controlled Vital Signs Assessment: post-procedure vital signs reviewed and stable Respiratory status: spontaneous breathing, nonlabored ventilation, respiratory function stable and patient connected to nasal cannula oxygen Cardiovascular status: blood pressure returned to baseline and stable Postop Assessment: no apparent nausea or vomiting Anesthetic complications: no   No notable events documented.   Last Vitals:  Vitals:   01/01/21 0900 01/01/21 0922  BP: 117/71 (!) 121/57  Pulse: 87 72  Resp: 15 16  Temp: (!) 36.3 C 36.4 C  SpO2: 100% 97%    Last Pain:  Vitals:   01/01/21 0900  TempSrc:   PainSc: 6                  Corinda Gubler

## 2021-01-01 NOTE — Progress Notes (Signed)
Patient received from PACU in stable condition. Pt alert and oriented, drowsy. Denies any uncontrolled pain at this time. Lap sites CDI with dermabond closure.

## 2021-01-01 NOTE — Anesthesia Preprocedure Evaluation (Signed)
Anesthesia Evaluation  Patient identified by MRN, date of birth, ID band Patient awake    Reviewed: Allergy & Precautions, H&P , NPO status , Patient's Chart, lab work & pertinent test results, reviewed documented beta blocker date and time   Airway Mallampati: III  TM Distance: >3 FB Neck ROM: full    Dental  (+) Teeth Intact   Pulmonary neg pulmonary ROS,    Pulmonary exam normal        Cardiovascular Exercise Tolerance: Good negative cardio ROS Normal cardiovascular exam Rhythm:regular Rate:Normal     Neuro/Psych negative neurological ROS  negative psych ROS   GI/Hepatic negative GI ROS, Neg liver ROS,   Endo/Other  Morbid obesity  Renal/GU negative Renal ROS  negative genitourinary   Musculoskeletal   Abdominal   Peds  Hematology negative hematology ROS (+)   Anesthesia Other Findings History reviewed. No pertinent past medical history. Past Surgical History: No date: ABDOMINAL HYSTERECTOMY 08/17/2017: ESOPHAGOGASTRODUODENOSCOPY (EGD) WITH PROPOFOL; N/A     Comment:  Procedure: ESOPHAGOGASTRODUODENOSCOPY (EGD) WITH               PROPOFOL;  Surgeon: [name], MD;  Location[facility]               ENDOSCOPY;  Service: Gastroenterology;  Laterality: N/A; No date: HERNIA REPAIR No date: none BMI    Body Mass Index: 37.07 kg/m     Reproductive/Obstetrics negative OB ROS                             Anesthesia Physical Anesthesia Plan  ASA: 3 and emergent  Anesthesia Plan: General ETT   Post-op Pain Management:    Induction:   PONV Risk Score and Plan: 4 or greater  Airway Management Planned:   Additional Equipment:   Intra-op Plan:   Post-operative Plan:   Informed Consent: I have reviewed the patients History and Physical, chart, labs and discussed the procedure including the risks, benefits and alternatives for the proposed anesthesia with the patient or authorized  representative who has indicated his/her understanding and acceptance.     Dental Advisory Given  Plan Discussed with: CRNA  Anesthesia Plan Comments:         Anesthesia Quick Evaluation

## 2021-01-01 NOTE — TOC Progression Note (Signed)
Transition of Care [facility]) - Progression Note    Patient Details  Name: Kathryn Bryan MRN: [id number] Date of Birth: 1960/12/20  [facility]) CM/SW Contact  Caryn Section, RN Phone Number: 01/01/2021, 1:08 PM  Clinical Narrative:   Patient lives at home with family, has support at home if needed.  Patient does not currently have any home services.  She has no concerns about returning home after discharge, no concerns getting to appointments or getting medications.  Patient reports taking medications as prescribed.  Verbalizes no TOC needs at this time.  TOC contact information given, will follow for any needs that arise.    Expected Discharge Plan: Home/Self Care Barriers to Discharge: Continued Medical Work up  Expected Discharge Plan and Services Expected Discharge Plan: Home/Self Care   Discharge Planning Services: CM Consult   Living arrangements for the past 2 months: Single Family Home                                       Social Determinants of Health (SDOH) Interventions    Readmission Risk Interventions No flowsheet data found.

## 2021-01-02 NOTE — Progress Notes (Signed)
Patient ID: Kathryn Bryan, female   DOB: 1961/01/03, 60 y.o.   MRN: [id number]     SURGICAL PROGRESS NOTE   Hospital Day(s): 1.   Interval History: Patient seen and examined, no acute events or new complaints overnight. Patient reports having significant pain in surgical area.  Pain still not being able to control with oral pain medications.  She reports that she has not been able to get out of bed due to pain.  Vital signs in last 24 hours: [min-max] current  Temp:  [97.6 F (36.4 C)-98.7 F (37.1 C)] 98.2 F (36.8 C) (07/14 1153) Pulse Rate:  [72-98] 72 (07/14 1153) Resp:  [16-18] 18 (07/14 1153) BP: (118-157)/(63-85) 118/63 (07/14 1153) SpO2:  [98 %-100 %] 100 % (07/14 1153)     Height: 5\' 1"  (154.9 cm) Weight: 89 kg BMI (Calculated): 37.09   Physical Exam:  Constitutional: alert, cooperative and no distress  Respiratory: breathing non-labored at rest  Cardiovascular: regular rate and sinus rhythm  Gastrointestinal: soft, tender, and non-distended  Labs:  CBC Latest Ref Rng & Units 12/31/2020 06/01/2017 05/31/2017  WBC 4.0 - 10.5 K/uL 10.9(H) 6.5 10.2  Hemoglobin 12.0 - 15.0 g/dL 14/03/2017 60.7 37.1  Hematocrit 36.0 - 46.0 % 45.2 38.8 40.4  Platelets 150 - 400 K/uL 277 283 293   CMP Latest Ref Rng & Units 12/31/2020 06/01/2017 05/31/2017  Glucose 70 - 99 mg/dL 14/03/2017) 694(W) 546(E)  BUN 6 - 20 mg/dL 703(J) 13 16  Creatinine 0.44 - 1.00 mg/dL 00(X 3.81) 8.29(H  Sodium 135 - 145 mmol/L 138 140 139  Potassium 3.5 - 5.1 mmol/L 3.8 3.6 3.3(L)  Chloride 98 - 111 mmol/L 105 108 109  CO2 22 - 32 mmol/L 26 25 23   Calcium 8.9 - 10.3 mg/dL 9.0 3.71) 8.1(L)  Total Protein 6.5 - 8.1 g/dL 7.7 - -  Total Bilirubin 0.3 - 1.2 mg/dL 0.7 - -  Alkaline Phos 38 - 126 U/L 72 - -  AST 15 - 41 U/L 18 - -  ALT 0 - 44 U/L 14 - -    Imaging studies: No new pertinent imaging studies   Assessment/Plan:  60 y.o. female with incarcerated incisional hernia 1 Day Post-Op s/p robotic assisted  laparoscopic incisional hernia repair with mesh.  Patient still with significant pain.  With her healing okay.  We will continue with current pain management.  We will encourage patient to get out of bed.  We will consult PT for assessment and evaluation of mobility.  We will continue with DVT prophylaxis.  We will continue with same diet.  6.9(C, MD

## 2021-01-02 NOTE — TOC Progression Note (Signed)
Transition of Care [facility]) - Progression Note    Patient Details  Name: Kathryn Bryan MRN: [id number] Date of Birth: 1960/11/13  [facility]) CM/SW Contact  Caryn Section, RN Phone Number: 01/02/2021, 3:49 PM  Clinical Narrative:   Patient previously had no TOC needs, unable to get out of bed due to pain at this time, PT consult ordered by MD.  TOC contact information given, TOC will follow for needs.    Expected Discharge Plan: Home/Self Care Barriers to Discharge: Continued Medical Work up  Expected Discharge Plan and Services Expected Discharge Plan: Home/Self Care   Discharge Planning Services: CM Consult   Living arrangements for the past 2 months: Single Family Home                                       Social Determinants of Health (SDOH) Interventions    Readmission Risk Interventions No flowsheet data found.

## 2021-01-02 NOTE — Evaluation (Signed)
Physical Therapy Evaluation Patient Details Name: Kathryn Bryan MRN: [id number] DOB: [birth year] Today's Date: 01/02/2021   History of Present Illness  Kathryn Bryan is a 60 y.o. femalewho presents to the ED for evaluation of N/V/D. Patient presents to the ED for evaluation of less than 24 hours of epigastric discomfort, nausea, vomiting and diarrhea.  She reports symptoms starting about 30 minutes after eating a cheese omelette this morning with nausea and epigastric discomfort.  She reports copious watery diarrhea throughout the day today and one episode of nonbloody nonbilious emesis.  She reports "just feeling awful" and rather generalized fashion as well as "just feeling really bad" to her epigastric abdomen. reports some presyncopal lightheaded dizziness without syncope.   Clinical Impression  Pt admitted with above diagnosis. Pt supine in bed agreeable to PT services. Rates abdominal surgical site pin at 4-5/10 NPS and has most pain with movement. Prior to hernia and acute admission, pt completely independent with all ADL's and functional mobility. Throughout session pt required extra time to rest b/t transitional movements/transfers due to increased pain. Pt has adequate family support b/t daughter and husband at home. Pt educated on log roll technique for pain modulation and comfort at incision. MinA at shoulder/hips provided turning onto R side with HOB elevated and use of Ue's on bed rails. MInA to sit EOB. Supervision to stand with no AD with VC's to push up from bed with Ue's instead of counter. Pt amb 10' into recliner at decreased gait speed and guarded demeanor. Pt minguard due to IV pole management to optimize safety. Safe t/f back into recliner. Pt educated on diaphragmatic breathing during functional mobility to improve pain and reduce breath holding and how pressure build up in abdominal cavity can increase her pain. Pt demo and verbalized understanding. Pt currently with functional  limitations due to the deficits listed below (see PT Problem List). Pt will benefit from skilled PT to increase their independence and safety with mobility to allow discharge to the venue listed below.      Follow Up Recommendations Supervision for mobility/OOB    Equipment Recommendations  3in1 (PT)    Recommendations for Other Services       Precautions / Restrictions Precautions Precautions: Fall Restrictions Weight Bearing Restrictions: No      Mobility  Bed Mobility Overal bed mobility: Needs Assistance Bed Mobility: Supine to Sit     Supine to sit: HOB elevated;Min assist     General bed mobility comments: Educated on log roll technique. MinA on hip/shoulder to facilate turning onto side.    Transfers Overall transfer level: Needs assistance   Transfers: Sit to/from Stand Sit to Stand: Supervision            Ambulation/Gait   Gait Distance (Feet): 10 Feet Assistive device: None Gait Pattern/deviations: Step-to pattern;Shuffle Gait velocity: Slow moving and guarded due to pain.   General Gait Details: Limited mostly by pain.  Stairs            Wheelchair Mobility    Modified Rankin (Stroke Patients Only)       Balance Overall balance assessment: Needs assistance Sitting-balance support: No upper extremity supported;Feet supported Sitting balance-Leahy Scale: Good       Standing balance-Leahy Scale: Fair                               Pertinent Vitals/Pain Pain Assessment: 0-10 Pain Score: 5  Pain Descriptors /  Indicators: Operative site guarding Pain Intervention(s): Limited activity within patient's tolerance;Monitored during session;Repositioned    Home Living Family/patient expects to be discharged to:: Private residence Living Arrangements: Spouse/significant other Available Help at Discharge: Family Type of Home: House Home Access: Stairs to enter Entrance Stairs-Rails: None Entrance Stairs-Number of Steps:  2 Home Layout: One level Home Equipment: None;Grab bars - tub/shower Additional Comments: Reports getting handrails installed on her front porch.    Prior Function Level of Independence: Independent               Hand Dominance        Extremity/Trunk Assessment   Upper Extremity Assessment Upper Extremity Assessment: Defer to OT evaluation;Overall [facility] for tasks assessed    Lower Extremity Assessment Lower Extremity Assessment: Overall WFL for tasks assessed    Cervical / Trunk Assessment Cervical / Trunk Assessment: Normal  Communication   Communication: No difficulties  Cognition Arousal/Alertness: Awake/alert Behavior During Therapy: WFL for tasks assessed/performed Overall Cognitive Status: Within Functional Limits for tasks assessed                                        General Comments      Exercises Other Exercises Other Exercises: Education on diaphragmatic breathing for pain modulation. Other Exercises: Educated/performed log roll technique.   Assessment/Plan    PT Assessment Patient needs continued PT services  PT Problem List Decreased strength;Decreased mobility;Pain;Decreased activity tolerance       PT Treatment Interventions Therapeutic exercise;Gait training;Balance training;Stair training;Neuromuscular re-education;Functional mobility training;Therapeutic activities;Patient/family education    PT Goals (Current goals can be found in the Care Plan section)  Acute Rehab PT Goals Patient Stated Goal: go home and have less pain PT Goal Formulation: With patient/family Time For Goal Achievement: 01/09/21 Potential to Achieve Goals: Good    Frequency Min 2X/week   Barriers to discharge        Co-evaluation               AM-PAC PT "6 Clicks" Mobility  Outcome Measure Help needed turning from your back to your side while in a flat bed without using bedrails?: A Little Help needed moving from lying on your back to  sitting on the side of a flat bed without using bedrails?: A Little Help needed moving to and from a bed to a chair (including a wheelchair)?: A Little Help needed standing up from a chair using your arms (e.g., wheelchair or bedside chair)?: A Little Help needed to walk in hospital room?: A Little Help needed climbing 3-5 steps with a railing? : A Lot 6 Click Score: 17    End of Session Equipment Utilized During Treatment: Gait belt Activity Tolerance: Patient limited by pain Patient left: in chair;with call bell/phone within reach;with family/visitor present Nurse Communication: Mobility status PT Visit Diagnosis: Unsteadiness on feet (R26.81);Pain;Muscle weakness (generalized) (M62.81)    Time: 5462-7035 PT Time Calculation (min) (ACUTE ONLY): 38 min   Charges:   PT Evaluation $PT Eval Low Complexity: 1 Low PT Treatments $Therapeutic Activity: 23-37 mins        Michale Weikel M. Fairly IV, PT, DPT Physical Therapist- [place]  [facility]  01/02/2021, 4:58 PM

## 2021-01-03 MED ORDER — KETOROLAC TROMETHAMINE 30 MG/ML IJ SOLN
30.0000 mg | Freq: Three times a day (TID) | INTRAMUSCULAR | Status: AC
  Administered 2021-01-03 (×3): 30 mg via INTRAVENOUS
  Filled 2021-01-03 (×3): qty 1

## 2021-01-03 NOTE — Progress Notes (Signed)
Physical Therapy Treatment Patient Details Name: Kathryn Bryan MRN: [id number] DOB: December 13, 1960 Today's Date: 01/03/2021    History of Present Illness Kathryn Bryan is a 60 y.o. femalewho presents to the ED for evaluation of N/V/D. Patient presents to the ED for evaluation of less than 24 hours of epigastric discomfort, nausea, vomiting and diarrhea.  She reports symptoms starting about 30 minutes after eating a cheese omelette this morning with nausea and epigastric discomfort.  She reports copious watery diarrhea throughout the day today and one episode of nonbloody nonbilious emesis.  She reports "just feeling awful" and rather generalized fashion as well as "just feeling really bad" to her epigastric abdomen. reports some presyncopal lightheaded dizziness without syncope.    PT Comments    Pt received in bed agreeable to PT treatment. Improved ability to log roll without PT assist requiring supervision to sit EOB with fair carryover with log roll technique. Pt reports using RW and having less pain walking with it since previous session. Reports baseline pain as improved with new pain meds given compared to previous session. VC's for safe hand placement with STS to RW. Pt amb 40' with supervision at very slow cadence still limited by pain however remains stable and safe with no LOB or sway. Pt returned to recliner with improved safety with hand placement and sequencing of RW with no VC's needed. D/c recs remain appropriate and case manager notified PT recommending RW for D/c.     Follow Up Recommendations  Supervision for mobility/OOB;Other (comment)     Equipment Recommendations  3in1 (PT);Rolling walker with 5" wheels    Recommendations for Other Services       Precautions / Restrictions Precautions Precautions: Fall Restrictions Weight Bearing Restrictions: No    Mobility  Bed Mobility Overal bed mobility: Needs Assistance Bed Mobility: Supine to Sit     Supine to sit:  HOB elevated;Supervision     General bed mobility comments: Only required supervision. Fari carryover with log roll technique.    Transfers Overall transfer level: Needs assistance Equipment used: Rolling walker (2 wheeled) Transfers: Sit to/from Stand Sit to Stand: Supervision            Ambulation/Gait Ambulation/Gait assistance: Supervision Gait Distance (Feet): 40 Feet Assistive device: Rolling walker (2 wheeled) Gait Pattern/deviations: Step-to pattern Gait velocity: Very slow velocity.       Stairs             Wheelchair Mobility    Modified Rankin (Stroke Patients Only)       Balance Overall balance assessment: Needs assistance Sitting-balance support: No upper extremity supported;Feet supported Sitting balance-Leahy Scale: Good       Standing balance-Leahy Scale: Fair                              Cognition Arousal/Alertness: Awake/alert Behavior During Therapy: WFL for tasks assessed/performed Overall Cognitive Status: Within Functional Limits for tasks assessed                                        Exercises Other Exercises Other Exercises: log roll technique    General Comments        Pertinent Vitals/Pain Pain Assessment: Faces Faces Pain Scale: Hurts little more Pain Descriptors / Indicators: Operative site guarding Pain Intervention(s): Limited activity within patient's tolerance;Monitored during session;Repositioned;Premedicated before session  Home Living                      Prior Function            PT Goals (current goals can now be found in the care plan section) Acute Rehab PT Goals Patient Stated Goal: go home and have less pain PT Goal Formulation: With patient/family Time For Goal Achievement: 01/09/21 Potential to Achieve Goals: Good Progress towards PT goals: Progressing toward goals    Frequency    Min 2X/week      PT Plan Current plan remains appropriate     Co-evaluation              AM-PAC PT "6 Clicks" Mobility   Outcome Measure  Help needed turning from your back to your side while in a flat bed without using bedrails?: A Little Help needed moving from lying on your back to sitting on the side of a flat bed without using bedrails?: A Little Help needed moving to and from a bed to a chair (including a wheelchair)?: A Little Help needed standing up from a chair using your arms (e.g., wheelchair or bedside chair)?: A Little Help needed to walk in hospital room?: A Little Help needed climbing 3-5 steps with a railing? : A Lot 6 Click Score: 17    End of Session Equipment Utilized During Treatment: Gait belt Activity Tolerance: Patient limited by pain Patient left: in chair;with call bell/phone within reach Nurse Communication: Mobility status PT Visit Diagnosis: Unsteadiness on feet (R26.81);Pain;Muscle weakness (generalized) (M62.81)     Time: 3154-0086 PT Time Calculation (min) (ACUTE ONLY): 29 min  Charges:  $Gait Training: 8-22 mins                     Kathryn Bryan. Fairly IV, PT, DPT Physical Therapist- [place]  [facility]  01/03/2021, 12:35 PM

## 2021-01-03 NOTE — Progress Notes (Signed)
Patient ID: Kathryn Bryan, female   DOB: January 04, 1961, 60 y.o.   MRN: [id number]     SURGICAL PROGRESS NOTE   Hospital Day(s): 2.   Interval History: Patient seen and examined, no acute events or new complaints overnight. Patient reports still having significant pain unable to be controlled with oral pain medications.  Patient also reports decreased appetite.  There has been no fever or tachycardia.  Patient was able to do physical therapy.  Vital signs in last 24 hours: [min-max] current  Temp:  [97.9 F (36.6 C)-99.1 F (37.3 C)] 97.9 F (36.6 C) (07/15 0423) Pulse Rate:  [70-81] 70 (07/15 0423) Resp:  [16-18] 18 (07/15 0423) BP: (118-147)/(63-72) 128/69 (07/15 0423) SpO2:  [97 %-100 %] 97 % (07/15 0423)     Height: 5\' 1"  (154.9 cm) Weight: 89 kg BMI (Calculated): 37.09   Physical Exam:  Constitutional: alert, cooperative and no distress  Respiratory: breathing non-labored at rest  Cardiovascular: regular rate and sinus rhythm  Gastrointestinal: soft, tender, and non-distended  Labs:  CBC Latest Ref Rng & Units 12/31/2020 06/01/2017 05/31/2017  WBC 4.0 - 10.5 K/uL 10.9(H) 6.5 10.2  Hemoglobin 12.0 - 15.0 g/dL 14/03/2017 06.2 69.4  Hematocrit 36.0 - 46.0 % 45.2 38.8 40.4  Platelets 150 - 400 K/uL 277 283 293   CMP Latest Ref Rng & Units 12/31/2020 06/01/2017 05/31/2017  Glucose 70 - 99 mg/dL 14/03/2017) 627(O) 350(K)  BUN 6 - 20 mg/dL 938(H) 13 16  Creatinine 0.44 - 1.00 mg/dL 82(X 9.37) 1.69(C  Sodium 135 - 145 mmol/L 138 140 139  Potassium 3.5 - 5.1 mmol/L 3.8 3.6 3.3(L)  Chloride 98 - 111 mmol/L 105 108 109  CO2 22 - 32 mmol/L 26 25 23   Calcium 8.9 - 10.3 mg/dL 9.0 7.89) 8.1(L)  Total Protein 6.5 - 8.1 g/dL 7.7 - -  Total Bilirubin 0.3 - 1.2 mg/dL 0.7 - -  Alkaline Phos 38 - 126 U/L 72 - -  AST 15 - 41 U/L 18 - -  ALT 0 - 44 U/L 14 - -    Imaging studies: No new pertinent imaging studies   Assessment/Plan:  60 y.o. female with incarcerated incisional hernia 2 Day Post-Op s/p  robotic assisted laparoscopic incisional hernia repair with mesh.  Patient still with significant pain.  Will optimize pain medication with Toradol.  We will continue physical therapy.  We will continue with bowel regimen.  Unable to be discharged due to pain not controlled with oral pain medications.  3.8(B, MD

## 2021-01-03 NOTE — Progress Notes (Signed)
Physical Therapy Treatment Patient Details Name: Kathryn Bryan MRN: [id number] DOB: March 14, 1961 Today's Date: 01/03/2021    History of Present Illness Kathryn Bryan is a 60 y.o. femalewho presents to the ED for evaluation of N/V/D. Patient presents to the ED for evaluation of less than 24 hours of epigastric discomfort, nausea, vomiting and diarrhea.  She reports symptoms starting about 30 minutes after eating a cheese omelette this morning with nausea and epigastric discomfort.  She reports copious watery diarrhea throughout the day today and one episode of nonbloody nonbilious emesis.  She reports "just feeling awful" and rather generalized fashion as well as "just feeling really bad" to her epigastric abdomen. reports some presyncopal lightheaded dizziness without syncope.    PT Comments    Pt received seated in recliner. PT educated pt on seated therex pt can perform for LE strength with good understanding of how to perform. Continues to require supervision for all functional mobility at this time with ability to amb 59' today with beginning to initiate partial step through gait. Pt returned to supine in bed and RW adjusted for home use to appropriate height. Current D/c Recs remain appropriate.   Follow Up Recommendations  Supervision for mobility/OOB     Equipment Recommendations  3in1 (PT);Rolling walker with 5" wheels    Recommendations for Other Services       Precautions / Restrictions Precautions Precautions: Fall Restrictions Weight Bearing Restrictions: No    Mobility  Bed Mobility Overal bed mobility: Needs Assistance Bed Mobility: Supine to Sit;Sit to Supine     Supine to sit: HOB elevated;Supervision Sit to supine: Supervision   General bed mobility comments: Only required supervision. Fari carryover with log roll technique.    Transfers Overall transfer level: Needs assistance Equipment used: Rolling walker (2 wheeled) Transfers: Sit to/from  Stand Sit to Stand: Supervision            Ambulation/Gait Ambulation/Gait assistance: Supervision Gait Distance (Feet): 70 Feet Assistive device: Rolling walker (2 wheeled) Gait Pattern/deviations: Step-through pattern Gait velocity: Improved gait speed in p.m. session       Stairs             Wheelchair Mobility    Modified Rankin (Stroke Patients Only)       Balance Overall balance assessment: Needs assistance Sitting-balance support: No upper extremity supported;Feet supported Sitting balance-Leahy Scale: Good     Standing balance support: Bilateral upper extremity supported;During functional activity Standing balance-Leahy Scale: Fair                              Cognition Arousal/Alertness: Awake/alert Behavior During Therapy: WFL for tasks assessed/performed Overall Cognitive Status: Within Functional Limits for tasks assessed                                        Exercises General Exercises - Lower Extremity Long Arc Quad: AROM;Strengthening;Both;10 reps;Seated Hip ABduction/ADduction: AROM;Strengthening;Both;10 reps;Seated Hip Flexion/Marching: Seated;AROM;Strengthening;Both;10 reps Other Exercises Other Exercises: log roll technique    General Comments        Pertinent Vitals/Pain Pain Assessment: No/denies pain Faces Pain Scale: Hurts little more Pain Location: L abdomen Pain Descriptors / Indicators: Operative site guarding Pain Intervention(s): Limited activity within patient's tolerance;Monitored during session;Repositioned    Home Living Family/patient expects to be discharged to:: Private residence Living Arrangements: Spouse/significant other Available Help  at Discharge: Family Type of Home: House Home Access: Stairs to enter Entrance Stairs-Rails: None Home Layout: One level Home Equipment: None;Grab bars - tub/shower Additional Comments: Reports getting handrails installed on her front porch.     Prior Function Level of Independence: Independent          PT Goals (current goals can now be found in the care plan section) Acute Rehab PT Goals Patient Stated Goal: go home and have less pain PT Goal Formulation: With patient/family Time For Goal Achievement: 01/09/21 Potential to Achieve Goals: Good Progress towards PT goals: Progressing toward goals    Frequency    Min 2X/week      PT Plan Current plan remains appropriate    Co-evaluation              AM-PAC PT "6 Clicks" Mobility   Outcome Measure  Help needed turning from your back to your side while in a flat bed without using bedrails?: A Little Help needed moving from lying on your back to sitting on the side of a flat bed without using bedrails?: A Little Help needed moving to and from a bed to a chair (including a wheelchair)?: A Little Help needed standing up from a chair using your arms (e.g., wheelchair or bedside chair)?: A Little Help needed to walk in hospital room?: A Little Help needed climbing 3-5 steps with a railing? : A Lot 6 Click Score: 17    End of Session Equipment Utilized During Treatment: Gait belt Activity Tolerance: Patient limited by pain Patient left: in chair;with call bell/phone within reach Nurse Communication: Mobility status PT Visit Diagnosis: Unsteadiness on feet (R26.81);Pain;Muscle weakness (generalized) (M62.81)     Time: 0277-4128 PT Time Calculation (min) (ACUTE ONLY): 29 min  Charges:  $Gait Training: 8-22 mins $Therapeutic Exercise: 8-22 mins                    Javonne Louissaint M. Fairly IV, PT, DPT Physical Therapist- [place]  [facility]  01/03/2021, 3:31 PM

## 2021-01-04 NOTE — Progress Notes (Signed)
Patient ID: Kathryn Bryan, female   DOB: 12/07/1960, 60 y.o.   MRN: [id number]     SURGICAL PROGRESS NOTE   Hospital Day(s): 3.   Interval History: Patient seen and examined, no acute events or new complaints overnight. Patient reports starting to feel better today.  She still has significant pain in need of IV pain medications.  She has been able to eat better.  She had a small bowel movement.  Denies nausea or vomiting.  Vital signs in last 24 hours: [min-max] current  Temp:  [97.6 F (36.4 C)-98.2 F (36.8 C)] 97.8 F (36.6 C) (07/16 0725) Pulse Rate:  [56-72] 69 (07/16 0725) Resp:  [15-18] 16 (07/16 0725) BP: (114-131)/(51-79) 131/51 (07/16 0725) SpO2:  [97 %-100 %] 98 % (07/16 0725)     Height: 5\' 1"  (154.9 cm) Weight: 89 kg BMI (Calculated): 37.09   Physical Exam:  Constitutional: alert, cooperative and no distress  Respiratory: breathing non-labored at rest  Cardiovascular: regular rate and sinus rhythm  Gastrointestinal: soft, right-tender, and non-distended  Labs:  CBC Latest Ref Rng & Units 12/31/2020 06/01/2017 05/31/2017  WBC 4.0 - 10.5 K/uL 10.9(H) 6.5 10.2  Hemoglobin 12.0 - 15.0 g/dL 14/03/2017 51.8 84.1  Hematocrit 36.0 - 46.0 % 45.2 38.8 40.4  Platelets 150 - 400 K/uL 277 283 293   CMP Latest Ref Rng & Units 12/31/2020 06/01/2017 05/31/2017  Glucose 70 - 99 mg/dL 14/03/2017) 630(Z) 601(U)  BUN 6 - 20 mg/dL 932(T) 13 16  Creatinine 0.44 - 1.00 mg/dL 55(D 3.22) 0.25(K  Sodium 135 - 145 mmol/L 138 140 139  Potassium 3.5 - 5.1 mmol/L 3.8 3.6 3.3(L)  Chloride 98 - 111 mmol/L 105 108 109  CO2 22 - 32 mmol/L 26 25 23   Calcium 8.9 - 10.3 mg/dL 9.0 2.70) 8.1(L)  Total Protein 6.5 - 8.1 g/dL 7.7 - -  Total Bilirubin 0.3 - 1.2 mg/dL 0.7 - -  Alkaline Phos 38 - 126 U/L 72 - -  AST 15 - 41 U/L 18 - -  ALT 0 - 44 U/L 14 - -    Imaging studies: No new pertinent imaging studies   Assessment/Plan:  60 y.o. female with incarcerated incisional hernia 3 Day Post-Op s/p robotic  assisted laparoscopic incisional hernia repair with mesh.  Patient slowly improving.  Pain slowly getting more controlled with the addition of Tylenol.  I would like to give her 24 hours of observation to see if she can tolerate pain better without the need of IV pain medications.  Patient slowly mobilizing better as well.  Tolerating diet.  No issues with local care.  We will continue DVT prophylaxis.  Possible discharge tomorrow if continues to improve.  6.2(B, MD

## 2021-01-05 MED ORDER — HYDROCODONE-ACETAMINOPHEN 5-325 MG PO TABS: 1.0000 | ORAL_TABLET | ORAL | 0 refills | Status: AC | PRN

## 2021-01-05 MED ORDER — PANTOPRAZOLE SODIUM 40 MG PO TBEC: 40.0000 mg | DELAYED_RELEASE_TABLET | Freq: Every day | ORAL | 0 refills | Status: AC

## 2021-01-05 NOTE — Discharge Instructions (Signed)

## 2021-01-05 NOTE — Discharge Summary (Signed)
  Patient ID: Kathryn Bryan MRN: [id number] DOB/AGE: January 19, 1961 60 y.o.  Admit date: 01/01/2021 Discharge date: 01/05/2021   Discharge Diagnoses:  Active Problems:   Incarcerated incisional hernia   Procedures: Robotic assisted laparoscopic incarcerated incisional hernia repair  Hospital Course: Patient with incarcerated incisional hernia with fever and severe pain. Underwent repair. Prolonged recovery due to pain control but recovered adequately without complication. Today ambulating well, tolerating diet, pain controlled. Wounds are dry and clean.   Physical Exam Cardiovascular:     Rate and Rhythm: Normal rate and regular rhythm.     Pulses: Normal pulses.  Pulmonary:     Effort: Pulmonary effort is normal.  Abdominal:     General: Abdomen is flat.  Musculoskeletal:     Cervical back: Normal range of motion.  Neurological:     Mental Status: She is alert and oriented to person, place, and time.     Consults: None  Disposition: Discharge disposition: 01-Home or Self Care      Discharge Instructions     Diet - low sodium heart healthy   Complete by: As directed       Allergies as of 01/05/2021       Reactions   Chocolate Flavor    Ibuprofen    Pt states has ulcers        Medication List     TAKE these medications    acetaminophen 325 MG tablet Commonly known as: TYLENOL Take 650 mg by mouth every 6 (six) hours as needed for mild pain or moderate pain.   desipramine 25 MG tablet Commonly known as: NORPRAMIN Take 1 tablet (25 mg total) by mouth at bedtime.   HYDROcodone-acetaminophen 5-325 MG tablet Commonly known as: Norco Take 1 tablet by mouth every 4 (four) hours as needed for up to 3 days for moderate pain.   pantoprazole 40 MG tablet Commonly known as: Protonix Take 1 tablet (40 mg total) by mouth 2 (two) times daily before a meal. What changed: Another medication with the same name was added. Make sure you understand how and when to  take each.   pantoprazole 40 MG tablet Commonly known as: Protonix Take 1 tablet (40 mg total) by mouth daily. What changed: You were already taking a medication with the same name, and this prescription was added. Make sure you understand how and when to take each.               Durable Medical Equipment  (From admission, onward)           Start     Ordered   01/03/21 1126  For home use only DME Walker rolling  Once       Question Answer Comment  Walker: With 5 Inch Wheels   Patient needs a walker to treat with the following condition Unstable gait      01/03/21 1125   01/03/21 1125  For home use only DME 3 n 1  Once        01/03/21 1125            Follow-up Information     Carolan Shiver, MD Follow up on 01/10/2021.   Specialty: General Surgery Contact information: [address] [place] [address] [phone]

## 2023-10-16 ENCOUNTER — Encounter: Payer: Self-pay | Admitting: Emergency Medicine

## 2023-10-16 ENCOUNTER — Ambulatory Visit
Admission: EM | Admit: 2023-10-16 | Discharge: 2023-10-16 | Disposition: A | Discharge status: Home / Self Care | Attending: Emergency Medicine | Admitting: Emergency Medicine

## 2023-10-16 DIAGNOSIS — R0789 Other chest pain: Secondary | ICD-10-CM

## 2023-10-16 NOTE — ED Provider Notes (Signed)
 [facility] [place], [place]   Name: Kathryn Bryan DOB: July 24, 1960 MRN: [id number] CSN: [id number] PCP: Sheryn Doom, MD  Arrival date and time:  10/16/23 1409  Chief Complaint:  Chest Pain and Headache   NOTE: Prior to seeing the patient today, I have reviewed the triage nursing documentation and vital signs. Clinical staff has updated patient's PMH/PSHx, current medication list, and drug allergies/intolerances to ensure comprehensive history available to assist in medical decision making.   History:   HPI: Kathryn Bryan is a 63 y.o. female who presents today with husband  with complaints of chest pain, headache and fatigue.  Patient states is ongoing for the past week.  She believes it may be related to fatigue, but her husband insist that she received further evaluation at this facility.  She has no significant cardiac history, no significant pulmonary history and no recent instances of chest pain.   History reviewed. No pertinent past medical history.  Past Surgical History:  Procedure Laterality Date   ABDOMINAL HYSTERECTOMY     ESOPHAGOGASTRODUODENOSCOPY (EGD) WITH PROPOFOL  N/A 08/17/2017   Procedure: ESOPHAGOGASTRODUODENOSCOPY (EGD) WITH PROPOFOL ;  Surgeon: Luke Salaam, MD;  Location: [facility] ENDOSCOPY;  Service: Gastroenterology;  Laterality: N/A;   HERNIA REPAIR     none     XI ROBOTIC ASSISTED VENTRAL HERNIA N/A 01/01/2021   Procedure: XI ROBOTIC ASSISTED VENTRAL HERNIA REPAIR;  Surgeon: Eldred Grego, MD;  Location: [facility] ORS;  Service: General;  Laterality: N/A;    Family History  Problem Relation Age of Onset   Heart failure Mother     Social History   Tobacco Use   Smoking status: Never   Smokeless tobacco: Never  Vaping Use   Vaping status: Never Used  Substance Use Topics   Alcohol use: No   Drug use: No    Patient Active Problem List   Diagnosis Date Noted   Incarcerated incisional hernia 01/01/2021   Morbid (severe) obesity  due to excess calories (HCC) 06/16/2017   Nausea & vomiting 05/31/2017   FH: diabetes mellitus 07/01/2015   Lumbago without sciatica 09/10/2014   Spondylosis of lumbar region without myelopathy or radiculopathy 09/10/2014   FH: colon cancer 01/15/2014   Enthesopathy of hip region 08/17/2012   Cyst of Bartholin's gland duct 08/17/2011   Lattice degeneration 04/10/2011   Open-angle glaucoma 05/04/2005   Acute gastritis 04/01/2004   Toxic diffuse goiter 10/24/2003    Home Medications:    No outpatient medications have been marked as taking for the 10/16/23 encounter [facility] Encounter).    Allergies:   Chocolate flavoring agent (non-screening) and Ibuprofen  Review of Systems (ROS): Review of Systems  Constitutional:  Positive for fatigue. Negative for activity change, chills, diaphoresis and fever.  Respiratory:  Positive for chest tightness and shortness of breath.   Gastrointestinal:  Negative for abdominal pain, diarrhea, nausea and vomiting.  All other systems reviewed and are negative.    Vital Signs: Today's Vitals   10/16/23 1413 10/16/23 1414 10/16/23 1415 10/16/23 1438  BP:   (!) 141/75   Pulse:   68   Resp:   15   Temp:   98.7 F (37.1 C)   TempSrc:   Oral   SpO2:   100%   Weight:  196 lb 3.4 oz (89 kg)    Height:  5\' 1"  (1.549 m)    PainSc: 7    7     Physical Exam: Physical Exam Vitals and nursing note reviewed.  Constitutional:      Appearance: Normal appearance.  Eyes:     Extraocular Movements: Extraocular movements intact.     Pupils: Pupils are equal, round, and reactive to light.  Cardiovascular:     Rate and Rhythm: Normal rate and regular rhythm.     Pulses: Normal pulses.     Heart sounds: Normal heart sounds.  Pulmonary:     Effort: Pulmonary effort is normal.     Breath sounds: Normal breath sounds.  Musculoskeletal:     Right lower leg: 1+ Pitting Edema present.     Left lower leg: 1+ Pitting Edema present.  Skin:    General: Skin  is warm and dry.  Neurological:     General: No focal deficit present.     Mental Status: She is alert and oriented to person, place, and time.  Psychiatric:        Mood and Affect: Mood normal.        Behavior: Behavior normal.      Urgent Care Treatments / Results:   LABS: PLEASE NOTE: all labs that were ordered this encounter are listed, however only abnormal results are displayed. Labs Reviewed - No data to display  EKG: -None  RADIOLOGY: No results found.  PROCEDURES: ED EKG  Date/Time: 10/16/2023 2:52 PM  Performed by: Providence Brow, NP Authorized by: Providence Brow, NP   Previous ECG:    Previous ECG:  Compared to current   Similarity:  No change Interpretation:    Interpretation: normal     MEDICATIONS RECEIVED THIS VISIT: Medications - No data to display  PERTINENT CLINICAL COURSE NOTES/UPDATES:   Initial Impression / Assessment and Plan / Urgent Care Course:  Pertinent labs & imaging results that were available during my care of the patient were personally reviewed by me and considered in my medical decision making (see lab/imaging section of note for values and interpretations).  Kathryn Bryan is a 63 y.o. female who presents to [facility] today with complaints of chest pain.  Patient and her husband and I had a detailed discussion regarding my concerns of a cardiac event, specifically with these continued symptoms, new onset lower extremity edema and elevated blood pressure.  I stated that while the symptoms may be related to fatigue, would be best to rule out an NSTEMI at emergency department.  Patient was hesitant but agreed to go to [facility].  Final Clinical Impressions / Urgent Care Diagnoses:   Final diagnoses:  Atypical chest pain    New Prescriptions:   Controlled Substance Registry consulted? Not Applicable  No orders of the defined types were placed in this encounter.     Discharge Instructions      You were  seen for chest pain and headache.  - While your EKG looks okay here today, I am asking that you get further evaluation at [facility] emergency department.  Take care, Dr. Amye Baller, NP-c     Recommended Follow up Care:  Patient encouraged to follow up with the following provider within the specified time frame, or sooner as dictated by the severity of her symptoms. As always, she was instructed that for any urgent/emergent care needs, she should seek care either here or in the emergency department for more immediate evaluation.   Providence Brow, DNP, NP-c   Providence Brow, NP 10/16/23 [phone]

## 2023-10-16 NOTE — ED Notes (Signed)
 Patient is being discharged from the Urgent Care and sent to the [facility] Emergency Department via private vehicle with spouse . Per Providence Brow, NP, patient is in need of higher level of care due to chest pain , SOB. Patient is aware and verbalizes understanding of plan of care.  Vitals:   10/16/23 1415  BP: (!) 141/75  Pulse: 68  Resp: 15  Temp: 98.7 F (37.1 C)  SpO2: 100%

## 2023-10-16 NOTE — ED Triage Notes (Signed)
 Patient c/o chest pain, SOB, left arm tingling and headache that started last night.

## 2023-10-16 NOTE — Discharge Instructions (Addendum)
 You were seen for chest pain and headache.  - While your EKG looks okay here today, I am asking that you get further evaluation at [facility] emergency department.  Take care, Dr. Amye Baller, NP-c
